# Patient Record
Sex: Female | Born: 1980 | Race: White | Hispanic: No | Smoking: Former smoker
Health system: Southern US, Community
[De-identification: ages and names within clinical notes are randomized; demographics above are authoritative.]

## PROBLEM LIST (undated history)

## (undated) DIAGNOSIS — F419 Anxiety disorder, unspecified: Secondary | ICD-10-CM

## (undated) DIAGNOSIS — F32A Depression, unspecified: Secondary | ICD-10-CM

## (undated) HISTORY — PX: OVARIAN CYST REMOVAL: SHX89

## (undated) HISTORY — PX: HEMORRHOID SURGERY: SHX153

---

## 2021-02-18 DIAGNOSIS — Z419 Encounter for procedure for purposes other than remedying health state, unspecified: Secondary | ICD-10-CM | POA: Diagnosis not present

## 2021-03-05 ENCOUNTER — Encounter (HOSPITAL_COMMUNITY): Payer: Self-pay | Admitting: Family Medicine

## 2021-03-05 ENCOUNTER — Inpatient Hospital Stay (HOSPITAL_COMMUNITY): Payer: Medicaid Other

## 2021-03-05 ENCOUNTER — Inpatient Hospital Stay (HOSPITAL_COMMUNITY)
Admission: AD | Admit: 2021-03-05 | Discharge: 2021-03-05 | Disposition: A | Discharge status: Home / Self Care | Payer: Medicaid Other | Attending: Family Medicine | Admitting: Family Medicine

## 2021-03-05 DIAGNOSIS — O26891 Other specified pregnancy related conditions, first trimester: Secondary | ICD-10-CM | POA: Insufficient documentation

## 2021-03-05 DIAGNOSIS — N939 Abnormal uterine and vaginal bleeding, unspecified: Secondary | ICD-10-CM | POA: Diagnosis not present

## 2021-03-05 DIAGNOSIS — Z3A01 Less than 8 weeks gestation of pregnancy: Secondary | ICD-10-CM | POA: Insufficient documentation

## 2021-03-05 DIAGNOSIS — O209 Hemorrhage in early pregnancy, unspecified: Secondary | ICD-10-CM | POA: Diagnosis not present

## 2021-03-05 DIAGNOSIS — Z87891 Personal history of nicotine dependence: Secondary | ICD-10-CM | POA: Diagnosis not present

## 2021-03-05 DIAGNOSIS — O208 Other hemorrhage in early pregnancy: Secondary | ICD-10-CM | POA: Diagnosis not present

## 2021-03-05 DIAGNOSIS — R109 Unspecified abdominal pain: Secondary | ICD-10-CM | POA: Diagnosis not present

## 2021-03-05 HISTORY — DX: Depression, unspecified: F32.A

## 2021-03-05 HISTORY — DX: Anxiety disorder, unspecified: F41.9

## 2021-03-05 LAB — CBC
HCT: 37.9 % (ref 36.0–46.0)
Hemoglobin: 12.6 g/dL (ref 12.0–15.0)
MCH: 30.4 pg (ref 26.0–34.0)
MCHC: 33.2 g/dL (ref 30.0–36.0)
MCV: 91.5 fL (ref 80.0–100.0)
Platelets: 240 10*3/uL (ref 150–400)
RBC: 4.14 MIL/uL (ref 3.87–5.11)
RDW: 11.9 % (ref 11.5–15.5)
WBC: 10.3 10*3/uL (ref 4.0–10.5)
nRBC: 0 % (ref 0.0–0.2)

## 2021-03-05 LAB — URINALYSIS, ROUTINE W REFLEX MICROSCOPIC
Bilirubin Urine: NEGATIVE
Glucose, UA: NEGATIVE mg/dL
Ketones, ur: NEGATIVE mg/dL
Leukocytes,Ua: NEGATIVE
Nitrite: NEGATIVE
Protein, ur: NEGATIVE mg/dL
Specific Gravity, Urine: >1.03 — ABNORMAL HIGH (ref 1.005–1.030)
pH: 5.5 (ref 5.0–8.0)

## 2021-03-05 LAB — WET PREP, GENITAL
Clue Cells Wet Prep HPF POC: NONE SEEN
Sperm: NONE SEEN
Trich, Wet Prep: NONE SEEN
WBC, Wet Prep HPF POC: <10 (ref <10)
Yeast Wet Prep HPF POC: NONE SEEN

## 2021-03-05 LAB — ABO/RH: ABO/RH(D): O POS

## 2021-03-05 LAB — URINALYSIS, MICROSCOPIC (REFLEX)

## 2021-03-05 LAB — HCG, QUANTITATIVE, PREGNANCY: hCG, Beta Chain, Quant, S: 90090 m[IU]/mL — ABNORMAL HIGH (ref <5)

## 2021-03-05 LAB — POCT PREGNANCY, URINE: Preg Test, Ur: POSITIVE — AB

## 2021-03-05 LAB — HIV ANTIBODY (ROUTINE TESTING W REFLEX): HIV Screen 4th Generation wRfx: NONREACTIVE

## 2021-03-05 NOTE — MAU Provider Note (Signed)
History     CSN: [id number]  Arrival date and time: 03/05/21 1319   Event Date/Time   First Provider Initiated Contact with Patient 03/05/21 1411      Chief Complaint  Patient presents with   Vaginal Bleeding   HPI Marissa Munoz 41 y.o. [redacted]w[redacted]d Comes to MAU with cramping for a few days and vaginal bleeding that started yesterday.  Was spotting and now is wearing a pad as the bleeding is more persistent.    OB History     Gravida  1   Para      Term      Preterm      AB      Living         SAB      IAB      Ectopic      Multiple      Live Births              Past Medical History:  Diagnosis Date   Anxiety    Depression     Past Surgical History:  Procedure Laterality Date   HEMORRHOID SURGERY     OVARIAN CYST REMOVAL      History reviewed. No pertinent family history.  Social History   Tobacco Use   Smoking status: Former    Packs/day: 1.00    Types: Cigarettes   Smokeless tobacco: Never  Substance Use Topics   Alcohol use: Not Currently   Drug use: Never    Allergies:  Allergies  Allergen Reactions   Codeine Nausea And Vomiting    Medications Prior to Admission  Medication Sig Dispense Refill Last Dose   levocetirizine (XYZAL) 5 MG tablet Take 5 mg by mouth every evening.       Review of Systems  Constitutional:  Negative for fever.  Respiratory:  Negative for cough and shortness of breath.   Gastrointestinal:  Positive for abdominal pain.  Genitourinary:  Positive for vaginal bleeding. Negative for dysuria and vaginal discharge.  Physical Exam   Blood pressure 106/72, pulse 72, temperature 98.7 F (37.1 C), resp. rate 18, height 5\' 4"  (1.626 m), weight 60.8 kg, last menstrual period 01/17/2021.  Physical Exam Vitals and nursing note reviewed.  Constitutional:      Appearance: She is well-developed.  HENT:     Head: Normocephalic.  Cardiovascular:     Rate and Rhythm: Regular rhythm.  Pulmonary:     Effort:  Pulmonary effort is normal.  Abdominal:     Palpations: Abdomen is soft.     Tenderness: There is no abdominal tenderness. There is no guarding or rebound.  Musculoskeletal:        General: Normal range of motion.     Cervical back: Neck supple.  Skin:    General: Skin is warm and dry.  Neurological:     Mental Status: She is alert and oriented to person, place, and time.  Psychiatric:        Mood and Affect: Mood normal.        Behavior: Behavior normal.        Thought Content: Thought content normal.    MAU Course  Procedures  MDM Had blood drawn. Vaginal self swabs done. Will need to rule out pregnancy of unknown anatomic location. O positive blood type Ultrasound done - small subchorionic hemorrhage identified with living IUP. Will have appointment with MD soon -awaiting call from the office.  Assessment and Plan  IUP at [redacted]w[redacted]d with subchorionic hemorrhage causing  vaginal bleeding in first trimester.  Plan Pelvic rest Return with severe bleeding. Keep appointments as scheduled in the office. No smoking, no drugs, no alcohol.   Marissa Munoz 03/05/2021, 2:18 PM

## 2021-03-05 NOTE — MAU Note (Incomplete)
Pt had positive HPT and [employer] test. Has been having some cramping on and of for several week. Started spotting today.

## 2021-03-05 NOTE — Discharge Instructions (Signed)
This bleeding is from a subchorionic hemorrhage and we expect it to resolve over time.

## 2021-03-06 LAB — GC/CHLAMYDIA PROBE AMP (~~LOC~~) NOT AT ARMC
Chlamydia: NEGATIVE
Comment: NEGATIVE
Comment: NORMAL
Neisseria Gonorrhea: NEGATIVE

## 2021-03-21 DIAGNOSIS — Z419 Encounter for procedure for purposes other than remedying health state, unspecified: Secondary | ICD-10-CM | POA: Diagnosis not present

## 2021-03-30 ENCOUNTER — Other Ambulatory Visit: Payer: Self-pay

## 2021-03-30 ENCOUNTER — Ambulatory Visit (INDEPENDENT_AMBULATORY_CARE_PROVIDER_SITE_OTHER): Payer: Medicaid Other | Admitting: Family

## 2021-03-30 ENCOUNTER — Encounter: Payer: Self-pay | Admitting: Family

## 2021-03-30 VITALS — BP 94/66 | HR 60 | Temp 97.6°F | Ht 63.5 in | Wt 140.0 lb

## 2021-03-30 DIAGNOSIS — O2241 Hemorrhoids in pregnancy, first trimester: Secondary | ICD-10-CM

## 2021-03-30 DIAGNOSIS — L501 Idiopathic urticaria: Secondary | ICD-10-CM | POA: Diagnosis not present

## 2021-03-30 DIAGNOSIS — Z3A10 10 weeks gestation of pregnancy: Secondary | ICD-10-CM | POA: Diagnosis not present

## 2021-03-30 DIAGNOSIS — M5432 Sciatica, left side: Secondary | ICD-10-CM | POA: Insufficient documentation

## 2021-03-30 HISTORY — DX: Hemorrhoids in pregnancy, first trimester: O22.41

## 2021-03-30 LAB — COMPREHENSIVE METABOLIC PANEL
ALT: 10 U/L (ref 0–35)
AST: 12 U/L (ref 0–37)
Albumin: 3.8 g/dL (ref 3.5–5.2)
Alkaline Phosphatase: 40 U/L (ref 39–117)
BUN: 12 mg/dL (ref 6–23)
CO2: 28 mEq/L (ref 19–32)
Calcium: 8.8 mg/dL (ref 8.4–10.5)
Chloride: 104 mEq/L (ref 96–112)
Creatinine, Ser: 0.7 mg/dL (ref 0.40–1.20)
GFR: 106.82 mL/min (ref >60.00)
Glucose, Bld: 87 mg/dL (ref 70–99)
Potassium: 4.2 mEq/L (ref 3.5–5.1)
Sodium: 139 mEq/L (ref 135–145)
Total Bilirubin: 0.3 mg/dL (ref 0.2–1.2)
Total Protein: 6 g/dL (ref 6.0–8.3)

## 2021-03-30 LAB — TSH: TSH: 0.96 u[IU]/mL (ref 0.35–5.50)

## 2021-03-30 NOTE — Patient Instructions (Addendum)
Continue prenatal vitamins. Follow up with OBGYN for consult.   A referral was placed today for GI as well as the chiropractor.  Please let us know if you have not heard back within 1 week about your referral.  Stop by the lab prior to leaving today. I will notify you of your results once received.   It was a pleasure seeing you today! Please do not hesitate to reach out with any questions and or concerns.  Regards,   Mort Sawyers FNP-C

## 2021-03-30 NOTE — Assessment & Plan Note (Signed)
Pt to f/u with OBGYN., pt has appt already on books. Will order TSH and CMP as well for complete profile prior to appt. Continue prenatal vitamins.

## 2021-03-30 NOTE — Assessment & Plan Note (Signed)
Pt deferred physical exam. Ok to continue with sitz bath prn and to start tux pads.

## 2021-03-30 NOTE — Assessment & Plan Note (Signed)
Continue xyzal ?

## 2021-03-30 NOTE — Assessment & Plan Note (Signed)
Referral placed for chiropractor per pt request. Handout sent to [facility] for pt.

## 2021-03-30 NOTE — Progress Notes (Signed)
New patient Office Visit  Subjective:  Patient ID: Marissa Munoz, female    DOB: 1980-06-14  Age: 41 y.o. MRN: [id number]  CC:  Chief Complaint  Patient presents with   Establish Care    Would like referral to chiropractor for insurance.    HPI Marissa Munoz is here today to establish care.  Pt is without acute concerns.  Pt is pregnant approximately 10 weeks.  Is currently taking prenatal vitamins.   Tested positive for pregnancy back in beginning of January 2023. This is her first pregnancy.  Last period 01/17/21.  Did go to ER 03/05/21 for vaginal bleeding, at that time was [redacted]W[redacted]D Ultrasound done - small subchorionic hemorrhage identified with living IUP in Er. Told to f/u with OBGYN and PCP. 1/16 positive HCG retested.   Has appt with OBGYN 04/04/21 at [facility] with Dr. Aletha Halim.   Left side sciatic: wants referral to chiropractor. Chronic history, intermittent. Posterior leg radiculopathy when it is flaring. Finds it flares about 1-2 times a month, but can last for weeks. Uses heating pad and foam roller with mild relief. Does currently experience this as well.   Hemorrhoids: does have intense past with internal hemorrhoids. She has two external hemorrhoids, and has noticed starting to get a bit more irritate,d and concerned as she has chronic hemorrhoids looking to see if maybe this can be looked into to treat while pregnant. They are currently very irritated and swollen. A little bit painful. Taking warm salt baths at night which is providing her with some relief. Will use tucks pads if needed.   Past Medical History:  Diagnosis Date   Anxiety    Depression     Past Surgical History:  Procedure Laterality Date   HEMORRHOID SURGERY     internal hemorrhoids/ had had necrosis at the time/ rectal prolapse with fissure   OVARIAN CYST REMOVAL Right     No family history on file.  Social History   Socioeconomic History   Marital status:  Soil scientist    Spouse name: joey   Number of children: 0   Years of education: Not on file   Highest education level: Not on file  Occupational History   Occupation: unemployed  Tobacco Use   Smoking status: Former    Packs/day: 1.00    Years: 20.00    Pack years: 20.00    Types: Cigarettes   Smokeless tobacco: Never  Vaping Use   Vaping Use: Never used  Substance and Sexual Activity   Alcohol use: Not Currently   Drug use: Never   Sexual activity: Yes    Partners: Male  Other Topics Concern   Not on file  Social History Narrative   Currently unemployed, working on creative business   Newly pregnant, first child was unexpected   Not much known about family history. Mom passed away at age 80.    Social Determinants of Health   Financial Resource Strain: Not on file  Food Insecurity: Not on file  Transportation Needs: Not on file  Physical Activity: Not on file  Stress: Not on file  Social Connections: Not on file  Intimate Partner Violence: Not on file    Outpatient Medications Prior to Visit  Medication Sig Dispense Refill   levocetirizine (XYZAL) 5 MG tablet Take 5 mg by mouth every evening.     No facility-administered medications prior to visit.    Allergies  Allergen Reactions   Codeine Nausea And Vomiting  ROS Review of Systems  Constitutional:  Negative for chills, fatigue, fever and unexpected weight change.  Respiratory:  Negative for shortness of breath.   Cardiovascular:  Negative for chest pain, palpitations and leg swelling.  Gastrointestinal:  Negative for abdominal pain, anal bleeding, blood in stool, constipation, diarrhea, nausea and rectal pain.       Hemorrhoids itchy and a bit painful at times  Genitourinary:  Negative for difficulty urinating, dysuria, frequency, pelvic pain, vaginal bleeding, vaginal discharge and vaginal pain.  Musculoskeletal:  Positive for back pain (with left sided radiaton of pain down leg at times).       Objective:    Physical Exam Constitutional:      General: She is not in acute distress.    Appearance: Normal appearance. She is normal weight. She is not ill-appearing, toxic-appearing or diaphoretic.  HENT:     Head: Normocephalic.     Right Ear: Tympanic membrane normal.     Left Ear: Tympanic membrane normal.     Nose: Nose normal.     Mouth/Throat:     Mouth: Mucous membranes are moist.  Eyes:     Pupils: Pupils are equal, round, and reactive to light.  Cardiovascular:     Rate and Rhythm: Normal rate and regular rhythm.  Pulmonary:     Effort: Pulmonary effort is normal.     Breath sounds: Normal breath sounds.  Genitourinary:    Comments: Pt refuses rectal exam to evaluate hemorrhoid further, did advise pt that I would like to r/o strangulation/thrombosis and she refuses  Musculoskeletal:        General: No tenderness or signs of injury.     Right lower leg: No edema.     Left lower leg: No edema.  Skin:    General: Skin is warm.  Neurological:     General: No focal deficit present.     Mental Status: She is alert and oriented to person, place, and time.  Psychiatric:        Mood and Affect: Mood normal.        Behavior: Behavior normal.        Thought Content: Thought content normal.        Judgment: Judgment normal.     BP 94/66 (BP Location: Left Arm, Patient Position: Sitting, Cuff Size: Normal)    Pulse 60    Temp 97.6 F (36.4 C)    Ht 5' 3.5" (1.613 m)    Wt 140 lb (63.5 kg)    LMP 01/17/2021 (Exact Date) Comment: Meeting with OB next week   SpO2 98%    BMI 24.41 kg/m  Wt Readings from Last 3 Encounters:  03/30/21 140 lb (63.5 kg)  03/05/21 134 lb (60.8 kg)     There are no preventive care reminders to display for this patient.   There are no preventive care reminders to display for this patient.  No results found for: TSH Lab Results  Component Value Date   WBC 10.3 03/05/2021   HGB 12.6 03/05/2021   HCT 37.9 03/05/2021   MCV 91.5 03/05/2021    PLT 240 03/05/2021   No results found for: NA, K, CHLORIDE, CO2, GLUCOSE, BUN, CREATININE, BILITOT, ALKPHOS, AST, ALT, PROT, ALBUMIN, CALCIUM, ANIONGAP, EGFR, GFR No results found for: CHOL No results found for: HDL No results found for: LDLCALC No results found for: TRIG No results found for: CHOLHDL No results found for: HGBA1C    Assessment & Plan:   Problem List  Items Addressed This Visit       Cardiovascular and Mediastinum   Hemorrhoids during pregnancy in first trimester    Pt deferred physical exam. Ok to continue with sitz bath prn and to start tux pads.       Relevant Orders   Ambulatory referral to Gastroenterology     Nervous and Auditory   Chronic sciatica, left    Referral placed for chiropractor per pt request. Handout sent to [facility] for pt.       Relevant Orders   Ambulatory referral to Chiropractic     Musculoskeletal and Integument   Chronic idiopathic urticaria - Primary    Continue xyzal         Other   [redacted] weeks gestation of pregnancy    Pt to f/u with OBGYN., pt has appt already on books. Will order TSH and CMP as well for complete profile prior to appt. Continue prenatal vitamins.       Relevant Orders   TSH   Comprehensive metabolic panel    No orders of the defined types were placed in this encounter.   Follow-up: Return for annually for CPE.    Eugenia Pancoast, FNP

## 2021-04-03 ENCOUNTER — Inpatient Hospital Stay (HOSPITAL_COMMUNITY)
Admission: AD | Admit: 2021-04-03 | Discharge: 2021-04-04 | Disposition: A | Discharge status: Home / Self Care | Payer: Medicaid Other | Attending: Obstetrics & Gynecology | Admitting: Obstetrics & Gynecology

## 2021-04-03 ENCOUNTER — Encounter: Payer: Self-pay | Admitting: Family

## 2021-04-03 ENCOUNTER — Encounter (HOSPITAL_COMMUNITY): Payer: Self-pay | Admitting: Obstetrics & Gynecology

## 2021-04-03 ENCOUNTER — Other Ambulatory Visit: Payer: Self-pay

## 2021-04-03 DIAGNOSIS — Z885 Allergy status to narcotic agent status: Secondary | ICD-10-CM | POA: Insufficient documentation

## 2021-04-03 DIAGNOSIS — O039 Complete or unspecified spontaneous abortion without complication: Secondary | ICD-10-CM

## 2021-04-03 DIAGNOSIS — O09519 Supervision of elderly primigravida, unspecified trimester: Secondary | ICD-10-CM | POA: Diagnosis not present

## 2021-04-03 DIAGNOSIS — Z87891 Personal history of nicotine dependence: Secondary | ICD-10-CM | POA: Insufficient documentation

## 2021-04-03 DIAGNOSIS — Z3A00 Weeks of gestation of pregnancy not specified: Secondary | ICD-10-CM | POA: Diagnosis not present

## 2021-04-03 DIAGNOSIS — O209 Hemorrhage in early pregnancy, unspecified: Secondary | ICD-10-CM | POA: Diagnosis present

## 2021-04-03 DIAGNOSIS — Z3A10 10 weeks gestation of pregnancy: Secondary | ICD-10-CM | POA: Insufficient documentation

## 2021-04-03 DIAGNOSIS — Z3A11 11 weeks gestation of pregnancy: Secondary | ICD-10-CM | POA: Diagnosis not present

## 2021-04-03 LAB — CBC WITH DIFFERENTIAL/PLATELET
Abs Immature Granulocytes: 0.08 10*3/uL — ABNORMAL HIGH (ref 0.00–0.07)
Basophils Absolute: 0.1 10*3/uL (ref 0.0–0.1)
Basophils Relative: 1 %
Eosinophils Absolute: 0.4 10*3/uL (ref 0.0–0.5)
Eosinophils Relative: 4 %
HCT: 39.1 % (ref 36.0–46.0)
Hemoglobin: 13.5 g/dL (ref 12.0–15.0)
Immature Granulocytes: 1 %
Lymphocytes Relative: 22 %
Lymphs Abs: 2.5 10*3/uL (ref 0.7–4.0)
MCH: 31 pg (ref 26.0–34.0)
MCHC: 34.5 g/dL (ref 30.0–36.0)
MCV: 89.7 fL (ref 80.0–100.0)
Monocytes Absolute: 0.7 10*3/uL (ref 0.1–1.0)
Monocytes Relative: 6 %
Neutro Abs: 7.8 10*3/uL — ABNORMAL HIGH (ref 1.7–7.7)
Neutrophils Relative %: 66 %
Platelets: 257 10*3/uL (ref 150–400)
RBC: 4.36 MIL/uL (ref 3.87–5.11)
RDW: 12.3 % (ref 11.5–15.5)
WBC: 11.7 10*3/uL — ABNORMAL HIGH (ref 4.0–10.5)
nRBC: 0 % (ref 0.0–0.2)

## 2021-04-03 LAB — URINALYSIS, ROUTINE W REFLEX MICROSCOPIC
Bilirubin Urine: NEGATIVE
Glucose, UA: NEGATIVE mg/dL
Ketones, ur: NEGATIVE mg/dL
Leukocytes,Ua: NEGATIVE
Nitrite: NEGATIVE
Protein, ur: NEGATIVE mg/dL
Specific Gravity, Urine: 1.02 (ref 1.005–1.030)
pH: 6 (ref 5.0–8.0)

## 2021-04-03 NOTE — MAU Provider Note (Signed)
History     CSN: [id number]  Arrival date and time: 04/03/21 2214  Chief Complaint  Patient presents with   Abdominal Pain   Vaginal Bleeding   Marissa Munoz is a 41 yo female G1P0 at [redacted]w[redacted]d by LMP (c/s [redacted]w[redacted]d [place]) presenting to the MAU for evaluation of vaginal bleeding and lower abdominal pain.   Reports bilaterally lower abdominal cramping started this morning and has become progressively worse. Constant. She also noticed pink-tinged mucus when she wiped around 1400 today. She then noticed vaginal bleeding like a period with a quarter sized clot when she used the restroom later, prompting evaluation. No tissue like products seen, but did feel like the clot "looked different".   Prior to this, she has felt at her baseline. Denies any fever, vomiting, anorexia, CVA back pain, dysuria, vaginal discharge. No concern for STDs. + Nausea since this started. Has not taken any medications at home for relief.   This pregnancy was unplanned, but desired. 6 wk US showed an IUP + cardiac activity with a subchorionic hemorrhage.   She had an initial prenatal appointment scheduled with Dr. Ilda Basset later today (2/15).   Past Medical History:  Diagnosis Date   Anxiety    Depression     Past Surgical History:  Procedure Laterality Date   HEMORRHOID SURGERY     internal hemorrhoids/ had had necrosis at the time/ rectal prolapse with fissure   OVARIAN CYST REMOVAL Right     History reviewed. No pertinent family history.  Social History   Tobacco Use   Smoking status: Former    Packs/day: 1.00    Years: 20.00    Pack years: 20.00    Types: Cigarettes   Smokeless tobacco: Never  Vaping Use   Vaping Use: Never used  Substance Use Topics   Alcohol use: Not Currently   Drug use: Never    Allergies:  Allergies  Allergen Reactions   Codeine Nausea And Vomiting    Medications Prior to Admission  Medication Sig Dispense Refill Last Dose   levocetirizine (XYZAL) 5 MG tablet Take 5 mg by  mouth every evening.       Review of Systems  Constitutional:  Negative for chills, fatigue and fever.  Respiratory:  Negative for chest tightness and shortness of breath.   Cardiovascular:  Negative for leg swelling.  Gastrointestinal:  Positive for abdominal pain and nausea. Negative for blood in stool, constipation, diarrhea and vomiting.  Genitourinary:  Positive for vaginal bleeding. Negative for dysuria, frequency, hematuria and vaginal discharge.  Musculoskeletal:  Negative for back pain.  Skin:  Negative for rash.  Neurological:  Negative for dizziness, weakness, light-headedness, numbness and headaches.  Physical Exam   Blood pressure 121/76, pulse 84, temperature 98.5 F (36.9 C), resp. rate 17, height 5' 3.5" (1.613 m), weight 64.4 kg, last menstrual period 01/17/2021, SpO2 99 %.  Physical Exam Constitutional:      General: She is not in acute distress.    Appearance: She is well-developed. She is not ill-appearing or diaphoretic.  HENT:     Head: Normocephalic and atraumatic.     Mouth/Throat:     Mouth: Mucous membranes are moist.  Eyes:     Extraocular Movements: Extraocular movements intact.  Cardiovascular:     Rate and Rhythm: Normal rate.  Pulmonary:     Effort: Pulmonary effort is normal.  Abdominal:     Tenderness: There is no right CVA tenderness or left CVA tenderness.     Comments: Soft, non-distended.  Non-tender in all 4 quadrants but points to lower suprapubic/pelvis as location of pain. No skin changes. No Mcburney point tenderness. Small horizontal scar present in the LLQ.  Genitourinary:    Comments: Pelvic exam: VULVA: normal appearing vulva with no masses, tenderness or lesions, VAGINA: normal appearing vagina with normal color, no lesions, no pooling of blood in canal, blood-tinged mucus present. CERVIX: normal appearing cervix. Os appears closed with blood-tinged mucus present at the os. No active bleeding. Neurological:     General: No focal  deficit present.     Mental Status: She is alert.    MAU Course   MDM New onset vaginal bleeding/cramping in previously known IUP at [redacted]w[redacted]d by LMP  CBC, Bhcg quant  Bedside US with gestational sac and small appearing fetus. No cardiac activity able to be detected.  Ordered for formal viability scan. Offered zofran and tylenol, declined.   1218: CBC with hemoglobin WNL (13.5). B-Hcg quant [id number] (from 90,090 in 02/2021).   US showing 7.14mm CRL with no cardiac activity, measuring at [redacted]w[redacted]d. [place] on 03/05/2021 measuring [redacted]w[redacted]d with + cardiac activity. Overall considered diagnostic for failed pregnancy.   Assessment and Plan   1. Spontaneous miscarriage [place] diagnostic for failed pregnancy. Provided condolences. Discussed options: surgical, medical, vs expectant. She would like expectant management for now. Sent message to [facility] to cancel tomorrow's appt but reschedule for f/u in 1-2 weeks.   MAU return precautions strictly discussed. Provided expectations for likely increased cramping/bleeding. May use tylenol/ibuprofen. Declined zofran/opioid prescription. Discharged home in stable condition.     Marissa Munoz 04/03/2021, 10:53 PM

## 2021-04-03 NOTE — MAU Note (Signed)
Having lower abd pain all day which has gotten progressively worse. Tonight I have had spotting at first and then more bleeding like a period.

## 2021-04-04 ENCOUNTER — Encounter: Payer: Medicaid Other | Admitting: Obstetrics and Gynecology

## 2021-04-04 ENCOUNTER — Encounter (HOSPITAL_COMMUNITY): Payer: Self-pay | Admitting: Obstetrics & Gynecology

## 2021-04-04 ENCOUNTER — Inpatient Hospital Stay (EMERGENCY_DEPARTMENT_HOSPITAL)
Admission: AD | Admit: 2021-04-04 | Discharge: 2021-04-05 | Disposition: A | Discharge status: Home / Self Care | Payer: Medicaid Other | Source: Home / Self Care | Attending: Obstetrics & Gynecology | Admitting: Obstetrics & Gynecology

## 2021-04-04 ENCOUNTER — Inpatient Hospital Stay (HOSPITAL_COMMUNITY): Payer: Medicaid Other

## 2021-04-04 DIAGNOSIS — O039 Complete or unspecified spontaneous abortion without complication: Secondary | ICD-10-CM | POA: Insufficient documentation

## 2021-04-04 DIAGNOSIS — F17210 Nicotine dependence, cigarettes, uncomplicated: Secondary | ICD-10-CM | POA: Insufficient documentation

## 2021-04-04 DIAGNOSIS — Z3A11 11 weeks gestation of pregnancy: Secondary | ICD-10-CM | POA: Insufficient documentation

## 2021-04-04 LAB — HCG, QUANTITATIVE, PREGNANCY: hCG, Beta Chain, Quant, S: 3769 m[IU]/mL — ABNORMAL HIGH (ref <5)

## 2021-04-04 MED ORDER — IBUPROFEN 600 MG PO TABS
600.0000 mg | ORAL_TABLET | Freq: Once | ORAL | Status: AC
  Administered 2021-04-05: 600 mg via ORAL
  Filled 2021-04-04: qty 1

## 2021-04-04 MED ORDER — IBUPROFEN 600 MG PO TABS: 600.0000 mg | ORAL_TABLET | Freq: Four times a day (QID) | ORAL | 1 refills | Status: DC | PRN

## 2021-04-04 MED ORDER — HYDROMORPHONE HCL 1 MG/ML IJ SOLN
1.0000 mg | Freq: Once | INTRAMUSCULAR | Status: AC
  Administered 2021-04-04: 1 mg via INTRAMUSCULAR
  Filled 2021-04-04: qty 1

## 2021-04-04 NOTE — Discharge Instructions (Addendum)
You can take tylenol 1000mg  every 6 hours (up to 4,000mg  in 24 hours) and ibuprofen 600mg  every 6 hours.    About 4 out of 100 (0.25%) women will have a pregnancy loss in her lifetime.  One in five pregnancies is found to be an early pregnancy failure.  There are 3 ways to care for an early pregnancy failure:   Surgery, (2) Medicine, (3) Waiting for you to pass the pregnancy on your own. Waiting You may choose to wait, in which case your own body may complete the passing of the abnormal early pregnancy on its own in about 2-4 weeks Your bleeding may be heavy at times There is a small possibility that you may need surgery if the bleeding is too much or not all of the pregnancy has passed.  If you have any of the following problems you may call Maternity Admissions Unit at [phone]. If you have pain that does not get better  with pain medication Bleeding that soaks through 2 thick full-sized sanitary pads in an hour Foul smelling discharge Fever above 100.4 degrees F Even if you do not have any of these symptoms, you should have a follow-up exam to make sure you are healing properly. This appointment will be made for you before you leave the hospital. Your next normal period will start again in 4-6 week after the loss. You can get pregnant soon after the loss.

## 2021-04-04 NOTE — MAU Provider Note (Addendum)
Chief Complaint: Abdominal Pain   Event Date/Time   First Provider Initiated Contact with Patient 04/04/21 2210      SUBJECTIVE HPI: Marissa Munoz is a 41 y.o. G1P0 at [redacted]w[redacted]d by LMP who presents to maternity admissions reporting known missed abortion which began cramping and bleeding tonight.  Was diagnosed yesterday with missed abortion measuring 6wks.  Was brought from waiting room screaming and writhing in wheelchair, out of control.  .  Abdominal Pain This is a new problem. The current episode started today. The problem occurs constantly. The problem has been gradually worsening. The quality of the pain is cramping. The abdominal pain does not radiate. Pertinent negatives include no fever, headaches, myalgias, nausea or vomiting. Nothing aggravates the pain. The pain is relieved by Nothing. She has tried nothing for the symptoms.  RN Note: Pt reports she was seen yesterday and told that she was having a miscarriage. Pt reports with abdominal pain and vaginal bleeding  Past Medical History:  Diagnosis Date   Anxiety    Depression    Past Surgical History:  Procedure Laterality Date   HEMORRHOID SURGERY     internal hemorrhoids/ had had necrosis at the time/ rectal prolapse with fissure   OVARIAN CYST REMOVAL Right    Social History   Socioeconomic History   Marital status: Media planner    Spouse name: joey   Number of children: 0   Years of education: Not on file   Highest education level: Not on file  Occupational History   Occupation: unemployed  Tobacco Use   Smoking status: Former    Packs/day: 1.00    Years: 20.00    Pack years: 20.00    Types: Cigarettes   Smokeless tobacco: Never  Vaping Use   Vaping Use: Never used  Substance and Sexual Activity   Alcohol use: Not Currently   Drug use: Never   Sexual activity: Yes    Partners: Male  Other Topics Concern   Not on file  Social History Narrative   Currently unemployed, working on creative business    Newly pregnant, first child was unexpected   Not much known about family history. Mom passed away at age 69.    Social Determinants of Health   Financial Resource Strain: Not on file  Food Insecurity: Not on file  Transportation Needs: Not on file  Physical Activity: Not on file  Stress: Not on file  Social Connections: Not on file  Intimate Partner Violence: Not on file   No current facility-administered medications on file prior to encounter.   Current Outpatient Medications on File Prior to Encounter  Medication Sig Dispense Refill   Calcium-Magnesium 200-50 MG TABS Take by mouth.     levocetirizine (XYZAL) 5 MG tablet Take 5 mg by mouth every evening.     Allergies  Allergen Reactions   Codeine Nausea And Vomiting    I have reviewed patient's Past Medical Hx, Surgical Hx, Family Hx, Social Hx, medications and allergies.   ROS:  Review of Systems  Constitutional:  Negative for fever.  Gastrointestinal:  Positive for abdominal pain. Negative for nausea and vomiting.  Musculoskeletal:  Negative for myalgias.  Neurological:  Negative for headaches.  Review of Systems  Other systems negative   Physical Exam  Physical Exam Patient Vitals for the past 24 hrs:  BP Temp Temp src Pulse Resp SpO2  04/04/21 2243 (!) 133/91 97.7 F (36.5 C) Oral 77 (!) 24 100 %   Constitutional: Well-developed, well-nourished female  in considerable pain, unable to lie still.   Cardiovascular: normal rate Respiratory: normal effort GI: Abd soft, non-tender.  MS: Extremities nontender, no edema, normal ROM Neurologic: Alert and oriented x 4.  GU: Neg CVAT.  Vaginal bleeding, moderate.  LAB RESULTS  --/--/O POS (01/16 1354)  IMAGING US OB Comp Less 14 Wks  Result Date: 04/05/2021 CLINICAL DATA:  Missed abortion with passage of gestational sac just prior to ultrasound, severe pain EXAM: OBSTETRIC <14 WK ULTRASOUND TECHNIQUE: Transabdominal ultrasound was performed for evaluation of the  gestation as well as the maternal uterus and adnexal regions. COMPARISON:  None. FINDINGS: Intrauterine gestational sac: None Subchorionic hemorrhage:  None visualized. Maternal uterus/adnexae: Thickened endometrium, measuring 12 mm, with fluid/debris in the endometrial cavity and mild vascularity. No free fluid. IMPRESSION: No IUP is visualized. Thickened endometrium, measuring 12 mm, with fluid/debris in the endometrial cavity and mild vascularity. While this suggests residual products of conception, this is not unexpected given the known abortion in progress. If symptoms persist, consider follow-up pelvic ultrasound to assess for RPOC in 3-5 days. Electronically Signed   By: Charline Bills M.D.   On: 04/05/2021 02:37   US OB Comp Less 14 Wks  Result Date: 04/04/2021 CLINICAL DATA:  Pain and heavy bleeding. EXAM: OBSTETRIC <14 WK ULTRASOUND TECHNIQUE: Transabdominal ultrasound was performed for evaluation of the gestation as well as the maternal uterus and adnexal regions. COMPARISON:  03/05/2021. FINDINGS: Intrauterine gestational sac: Single Yolk sac:  No Embryo:  Yes Cardiac Activity: No Heart Rate: No fetal heart rate. CRL:   7.8 mm   6 w 4 d                  [place] EDC: 11/24/2021. Subchorionic hemorrhage:  None visualized. Maternal uterus/adnexae: The ovaries are not well seen on exam. No free fluid in the pelvis. IMPRESSION: Single intrauterine pregnancy with estimated gestational age of [redacted] weeks 4 days. No fetal cardiac activity or fetal heart rate is identified, concerning for failed early pregnancy. Follow-up beta hCG and follow-up ultrasound are recommended. Electronically Signed   By: Thornell Sartorius M.D.   On: 04/04/2021 00:37     MAU Management/MDM: DIlaudid IM given which did help pain.  Speculum exam done Clots and entire intact gestational sac were removed with Ring forceps Pain abated but then returned Patient became out of control again with cramping.  Percocet x 2 given  Phenergan IM  given.   Ibuprofen given Patient continued to be out of control with pain, insisting something was wrong.  Per Dr Macon Large, this is just uterine cramping, pt can be discharged [place] ordered for reassurance >within normal limits Percocet given PO which finally did relieve pain, so that patient could be discharged.  ASSESSMENT S[place]gle IUP at [redacted]w[redacted]d, missed abortion Completed spontaneous abortion  PLAN Discharge home Rx ibuprofen for pain  Has followup appt in office in 4 weeks  Pt stable at time of discharge. Encouraged to return here if she develops worsening of symptoms, increase in pain, fever, or other concerning symptoms.    Wynelle Bourgeois CNM, MSN Certified Nurse-Midwife 04/04/2021  10:58 PM

## 2021-04-04 NOTE — Progress Notes (Signed)
Dr Annia Friendly in earlier to discuss u/s results and d/c plan. Written and verbal d/c instructions given and understanding voiced. PT teary eyed due to dx of SAB. Comfort info and small knitted blanket given with emotional support. Significant other supportive

## 2021-04-04 NOTE — MAU Note (Signed)
Pt reports she was seen yesterday and told that she was having a miscarriage. Pt reports with abdominal pain and vaginal bleeding.

## 2021-04-05 ENCOUNTER — Inpatient Hospital Stay (HOSPITAL_COMMUNITY): Payer: Medicaid Other

## 2021-04-05 DIAGNOSIS — O039 Complete or unspecified spontaneous abortion without complication: Secondary | ICD-10-CM | POA: Diagnosis not present

## 2021-04-05 DIAGNOSIS — O09519 Supervision of elderly primigravida, unspecified trimester: Secondary | ICD-10-CM | POA: Diagnosis not present

## 2021-04-05 DIAGNOSIS — Z3A00 Weeks of gestation of pregnancy not specified: Secondary | ICD-10-CM | POA: Diagnosis not present

## 2021-04-05 DIAGNOSIS — Z3A11 11 weeks gestation of pregnancy: Secondary | ICD-10-CM

## 2021-04-05 MED ORDER — OXYCODONE-ACETAMINOPHEN 5-325 MG PO TABS
1.0000 | ORAL_TABLET | Freq: Once | ORAL | Status: AC
  Administered 2021-04-05: 2 via ORAL
  Filled 2021-04-05: qty 1

## 2021-04-05 MED ORDER — PROMETHAZINE HCL 25 MG/ML IJ SOLN
12.5000 mg | Freq: Once | INTRAMUSCULAR | Status: AC
  Administered 2021-04-05: 12.5 mg via INTRAMUSCULAR
  Filled 2021-04-05: qty 1

## 2021-04-05 MED ORDER — OXYCODONE-ACETAMINOPHEN 5-325 MG PO TABS
1.0000 | ORAL_TABLET | Freq: Once | ORAL | Status: DC
  Filled 2021-04-05: qty 1

## 2021-04-06 ENCOUNTER — Encounter: Payer: Self-pay | Admitting: *Deleted

## 2021-04-06 NOTE — Telephone Encounter (Signed)
Referral for Chiropractor sent to [facility] in [place]. Message sent to patient making her aware of where to call to schedule.   Pt declined scheduling GI appt at this time  Referral Notes    Date Time Type Summary User  04/04/2021 2:38 PM EST General Patient just had a miscarriage and don't want to schedule right now [facility], [place] Kung, CMA  Note Text:  Patient just had a miscarriage and don't want to schedule right now

## 2021-04-18 DIAGNOSIS — Z419 Encounter for procedure for purposes other than remedying health state, unspecified: Secondary | ICD-10-CM | POA: Diagnosis not present

## 2021-05-03 ENCOUNTER — Ambulatory Visit (INDEPENDENT_AMBULATORY_CARE_PROVIDER_SITE_OTHER): Payer: Medicaid Other | Admitting: Obstetrics and Gynecology

## 2021-05-03 ENCOUNTER — Encounter: Payer: Self-pay | Admitting: Obstetrics and Gynecology

## 2021-05-03 VITALS — BP 106/71 | HR 81 | Ht 64.0 in | Wt 140.6 lb

## 2021-05-03 DIAGNOSIS — Z5189 Encounter for other specified aftercare: Secondary | ICD-10-CM | POA: Diagnosis not present

## 2021-05-03 DIAGNOSIS — O039 Complete or unspecified spontaneous abortion without complication: Secondary | ICD-10-CM | POA: Diagnosis not present

## 2021-05-03 DIAGNOSIS — R319 Hematuria, unspecified: Secondary | ICD-10-CM

## 2021-05-03 NOTE — Progress Notes (Signed)
? ?Obstetrics and Gynecology ?New Patient Evaluation ? ?Appointment Date: 05/03/2021 ? ?OBGYN Clinic: [facility]  ? ?Primary Care Provider: Mort Sawyers ? ?Chief Complaint: follow up 7-8wk SAB ? ?History of Present Illness:  Marissa Munoz is a 41 y.o. G1P0010 s/p 2/15 SAB in the MAU.   ? ?Patient went to MAU on 1/16 for VB and u/s showed a 6/5 weeks with FHR 116. She had repeat 2/14-15 VB and u/s showed CRL of 6/4 weeks but no FHR. Options d/w her and she elected for exp management. She came back to the MAU later on that same day on with SAB in progress.  ? ?Patient still with some discomfort in her lower belly, no bleeding. ? ?Review of Systems: Pertinent items noted in HPI and remainder of comprehensive ROS otherwise negative.  ? ? ?Past Medical History:  ?Past Medical History:  ?Diagnosis Date  ? Anxiety   ? Depression   ? ? ?Past Surgical History:  ?Past Surgical History:  ?Procedure Laterality Date  ? HEMORRHOID SURGERY    ? internal hemorrhoids/ had had necrosis at the time/ rectal prolapse with fissure  ? OVARIAN CYST REMOVAL Right   ? ? ?Past Obstetrical History:  ?OB History  ?Gravida Para Term Preterm AB Living  ?1       1    ?SAB IAB Ectopic Multiple Live Births  ?1          ?  ?# Outcome Date GA Lbr Len/2nd Weight Sex Delivery Anes PTL Lv  ?1 SAB 03/2021 [redacted]w[redacted]d         ?  ?Obstetric Comments  ?  ? ?Past Gynecological History: As per HPI. ? ?Social History:  ?Social History  ? ?Socioeconomic History  ? Marital status: Media planner  ?  Spouse name: joey  ? Number of children: 0  ? Years of education: Not on file  ? Highest education level: Not on file  ?Occupational History  ? Occupation: unemployed  ?Tobacco Use  ? Smoking status: Former  ?  Packs/day: 1.00  ?  Years: 20.00  ?  Pack years: 20.00  ?  Types: Cigarettes  ? Smokeless tobacco: Never  ?Vaping Use  ? Vaping Use: Never used  ?Substance and Sexual Activity  ? Alcohol use: Not Currently  ? Drug use: Never  ?  Sexual activity: Yes  ?  Partners: Male  ?Other Topics Concern  ? Not on file  ?Social History Narrative  ? Currently unemployed, working on creative business  ? Newly pregnant, first child was unexpected  ? Not much known about family history. Mom passed away at age 54.   ? ?Social Determinants of Health  ? ?Financial Resource Strain: Not on file  ?Food Insecurity: Not on file  ?Transportation Needs: Not on file  ?Physical Activity: Not on file  ?Stress: Not on file  ?Social Connections: Not on file  ?Intimate Partner Violence: Not on file  ? ? ?Family History: No family history on file. ? ?Medications ?Christinia Gully had no medications administered during this visit. ?Current Outpatient Medications  ?Medication Sig Dispense Refill  ? ibuprofen (ADVIL) 600 MG tablet Take 1 tablet (600 mg total) by mouth every 6 (six) hours as needed. 30 tablet 1  ? levocetirizine (XYZAL) 5 MG tablet Take 5 mg by mouth every evening.    ? Calcium-Magnesium 200-50 MG TABS Take by mouth.    ? ?No current facility-administered medications for this visit.  ? ? ?Allergies ?Codeine ? ? ?  Physical Exam:  ?BP 106/71   Pulse 81   Ht 5\' 4"  (1.626 m)   Wt 140 lb 9.6 oz (63.8 kg)   LMP 01/17/2021 (Exact Date) Comment: Meeting with OB next week  Breastfeeding No   BMI 24.13 kg/m?  Body mass index is 24.13 kg/m?. ?Weight last year:  ?General appearance: Well nourished, well developed female in no acute distress.  ?Neuro/Psych:  Normal mood and affect.  ? ?Laboratory: +hematuria on u/a ? ?Radiology: as per HPI ? ?Assessment: pt stable ? ?Plan:  ?1. Follow-up visit after miscarriage ?Condolences given and d/w her that likely due to [facility] as cause of SAB ?Will get f/u u/s to ensure no retained POCs ?Pap and contraception declined today. Recommend staying on folic acid if considering trying again. D/w her re: timing and increased SAB risk with AMA ?- [facility] PELVIC COMPLETE WITH TRANSVAGINAL; Future ?- Urine Culture ?- Urinalysis, Routine w reflex  microscopic ? ?2. Hematuria, unspecified type ?No e/o infection. Will send formal urine studies. ?- Urinalysis, Routine w reflex microscopic ? ?RTC 6wks for annual and pap ? ?[place] MD ?Attending ?[facility] [employer]) ? ?

## 2021-05-04 LAB — URINALYSIS, ROUTINE W REFLEX MICROSCOPIC
Bilirubin, UA: NEGATIVE
Glucose, UA: NEGATIVE
Ketones, UA: NEGATIVE
Leukocytes,UA: NEGATIVE
Nitrite, UA: NEGATIVE
Protein,UA: NEGATIVE
Specific Gravity, UA: 1.027 (ref 1.005–1.030)
Urobilinogen, Ur: 0.2 mg/dL (ref 0.2–1.0)
pH, UA: 5 (ref 5.0–7.5)

## 2021-05-04 LAB — MICROSCOPIC EXAMINATION
Bacteria, UA: NONE SEEN
Casts: NONE SEEN /lpf
WBC, UA: NONE SEEN /hpf (ref 0–5)

## 2021-05-04 LAB — URINE CULTURE

## 2021-05-08 ENCOUNTER — Ambulatory Visit
Admission: RE | Admit: 2021-05-08 | Discharge: 2021-05-08 | Disposition: A | Discharge status: Home / Self Care | Payer: Medicaid Other | Source: Ambulatory Visit | Attending: Obstetrics and Gynecology | Admitting: Obstetrics and Gynecology

## 2021-05-08 ENCOUNTER — Other Ambulatory Visit: Payer: Self-pay

## 2021-05-08 DIAGNOSIS — R109 Unspecified abdominal pain: Secondary | ICD-10-CM | POA: Diagnosis not present

## 2021-05-08 DIAGNOSIS — Z5189 Encounter for other specified aftercare: Secondary | ICD-10-CM | POA: Insufficient documentation

## 2021-05-08 DIAGNOSIS — O039 Complete or unspecified spontaneous abortion without complication: Secondary | ICD-10-CM | POA: Diagnosis not present

## 2021-05-08 DIAGNOSIS — R188 Other ascites: Secondary | ICD-10-CM | POA: Diagnosis not present

## 2021-05-14 ENCOUNTER — Encounter: Payer: Self-pay | Admitting: Obstetrics and Gynecology

## 2021-05-19 DIAGNOSIS — Z419 Encounter for procedure for purposes other than remedying health state, unspecified: Secondary | ICD-10-CM | POA: Diagnosis not present

## 2021-06-06 ENCOUNTER — Ambulatory Visit: Payer: Medicaid Other | Admitting: Obstetrics and Gynecology

## 2021-06-18 DIAGNOSIS — Z419 Encounter for procedure for purposes other than remedying health state, unspecified: Secondary | ICD-10-CM | POA: Diagnosis not present

## 2021-07-19 DIAGNOSIS — Z419 Encounter for procedure for purposes other than remedying health state, unspecified: Secondary | ICD-10-CM | POA: Diagnosis not present

## 2021-08-18 DIAGNOSIS — Z419 Encounter for procedure for purposes other than remedying health state, unspecified: Secondary | ICD-10-CM | POA: Diagnosis not present

## 2021-09-18 DIAGNOSIS — Z419 Encounter for procedure for purposes other than remedying health state, unspecified: Secondary | ICD-10-CM | POA: Diagnosis not present

## 2021-10-19 DIAGNOSIS — Z419 Encounter for procedure for purposes other than remedying health state, unspecified: Secondary | ICD-10-CM | POA: Diagnosis not present

## 2021-11-18 DIAGNOSIS — Z419 Encounter for procedure for purposes other than remedying health state, unspecified: Secondary | ICD-10-CM | POA: Diagnosis not present

## 2021-12-19 DIAGNOSIS — Z419 Encounter for procedure for purposes other than remedying health state, unspecified: Secondary | ICD-10-CM | POA: Diagnosis not present

## 2022-01-18 DIAGNOSIS — Z419 Encounter for procedure for purposes other than remedying health state, unspecified: Secondary | ICD-10-CM | POA: Diagnosis not present

## 2022-02-18 DIAGNOSIS — Z419 Encounter for procedure for purposes other than remedying health state, unspecified: Secondary | ICD-10-CM | POA: Diagnosis not present

## 2022-03-21 DIAGNOSIS — Z419 Encounter for procedure for purposes other than remedying health state, unspecified: Secondary | ICD-10-CM | POA: Diagnosis not present

## 2022-04-19 DIAGNOSIS — Z419 Encounter for procedure for purposes other than remedying health state, unspecified: Secondary | ICD-10-CM | POA: Diagnosis not present

## 2022-05-20 DIAGNOSIS — Z419 Encounter for procedure for purposes other than remedying health state, unspecified: Secondary | ICD-10-CM | POA: Diagnosis not present

## 2022-06-19 DIAGNOSIS — Z419 Encounter for procedure for purposes other than remedying health state, unspecified: Secondary | ICD-10-CM | POA: Diagnosis not present

## 2022-07-20 DIAGNOSIS — Z419 Encounter for procedure for purposes other than remedying health state, unspecified: Secondary | ICD-10-CM | POA: Diagnosis not present

## 2022-08-19 DIAGNOSIS — Z419 Encounter for procedure for purposes other than remedying health state, unspecified: Secondary | ICD-10-CM | POA: Diagnosis not present

## 2022-09-19 DIAGNOSIS — Z419 Encounter for procedure for purposes other than remedying health state, unspecified: Secondary | ICD-10-CM | POA: Diagnosis not present

## 2022-10-20 DIAGNOSIS — Z419 Encounter for procedure for purposes other than remedying health state, unspecified: Secondary | ICD-10-CM | POA: Diagnosis not present

## 2022-10-22 IMAGING — US US OB < 14 WEEKS - US OB TV
1 series · 15 of 28 positions shown · non-contrast
Comparison: None.

CLINICAL DATA: vaginal bleeding. Six weeks 5 days pregnant by last
menstrual

EXAM:
OBSTETRIC <14 WK US AND TRANSVAGINAL OB US
TECHNIQUE: Both transabdominal and transvaginal ultrasound examinations were
performed for complete evaluation of the gestation as well as the
maternal uterus, adnexal regions, and pelvic cul-de-sac.
Transvaginal technique was performed to assess early pregnancy.

[Series 1: us ob < 14 weeks - us ob tv · 15 of 51 slices shown]
[im 1/51]
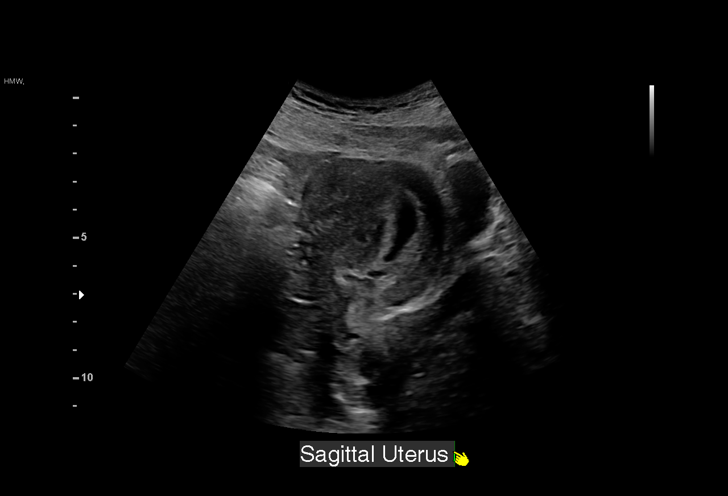
[im 4/51]
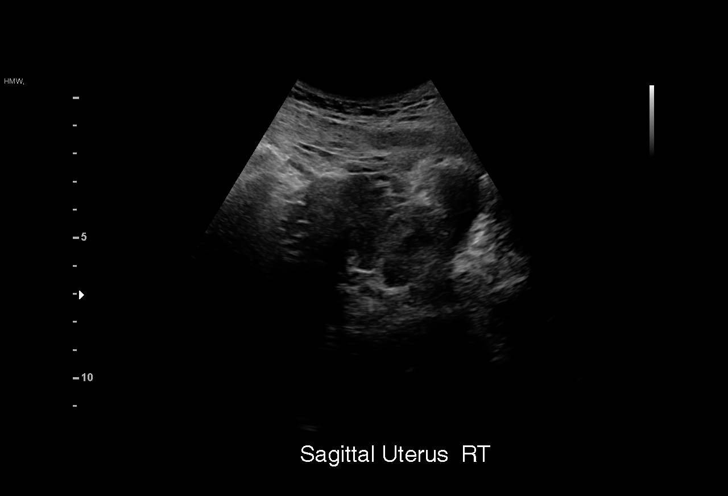
[im 8/51]
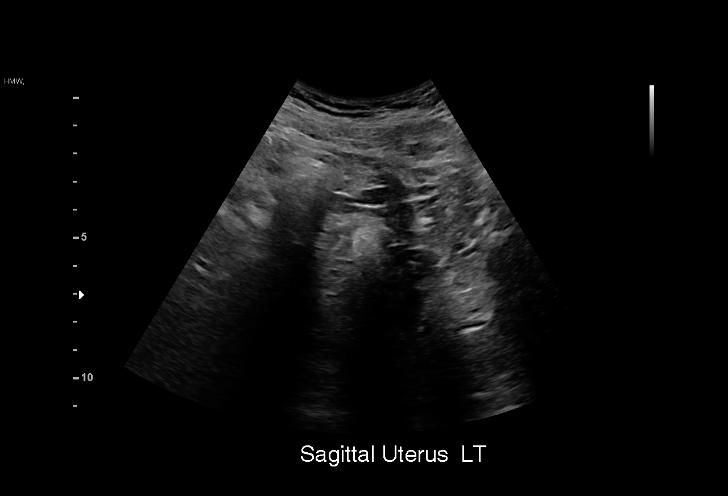
[im 12/51]
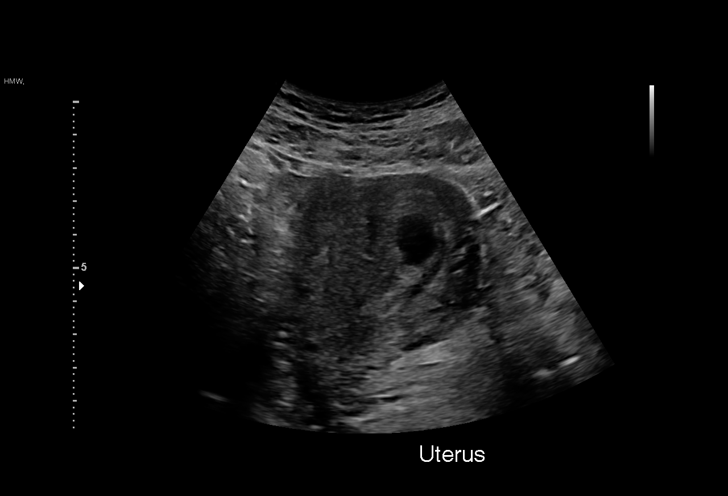
[im 15/51]
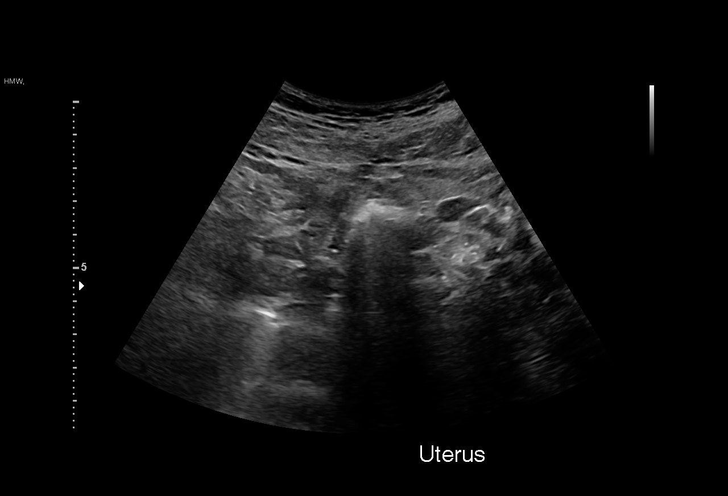
[im 19/51]
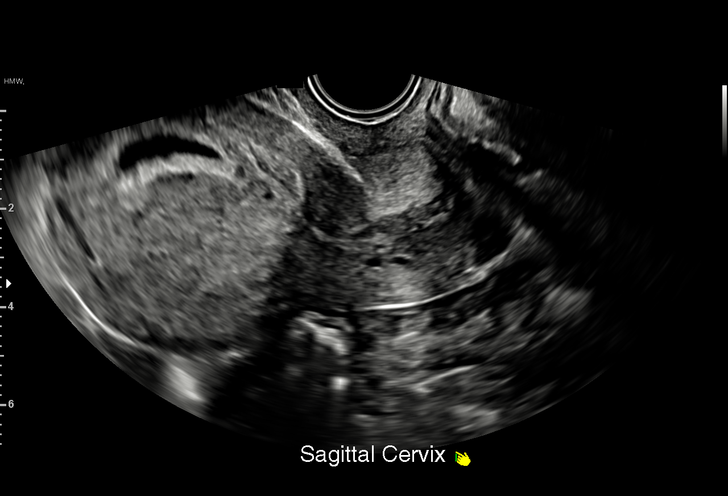
[im 23/51]
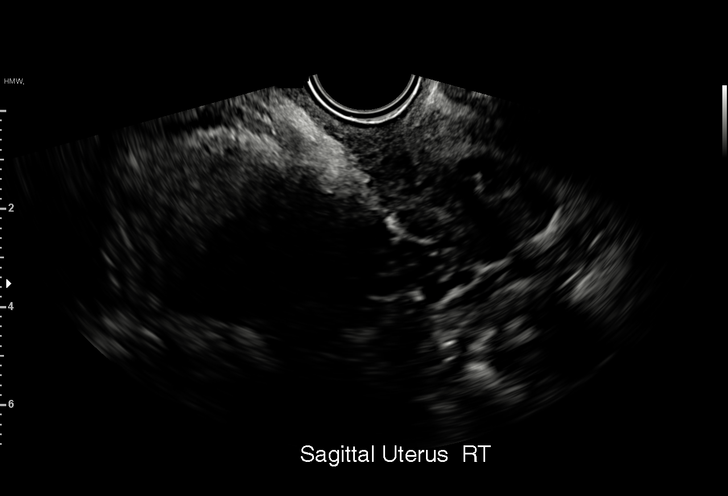
[im 26/51]
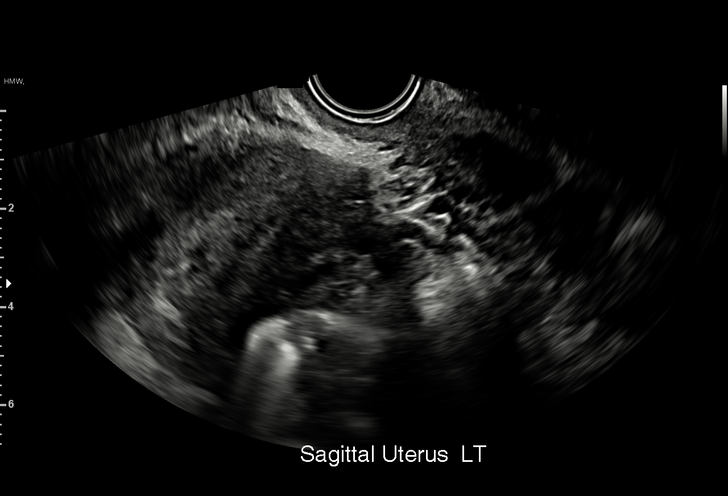
[im 28/51]
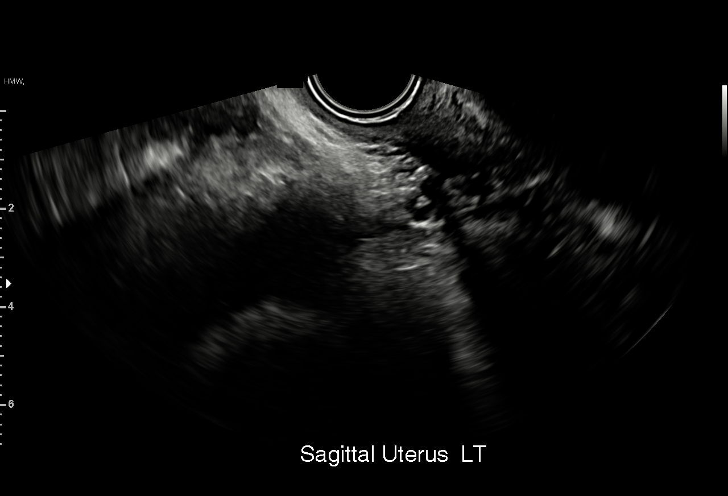
[im 32/51]
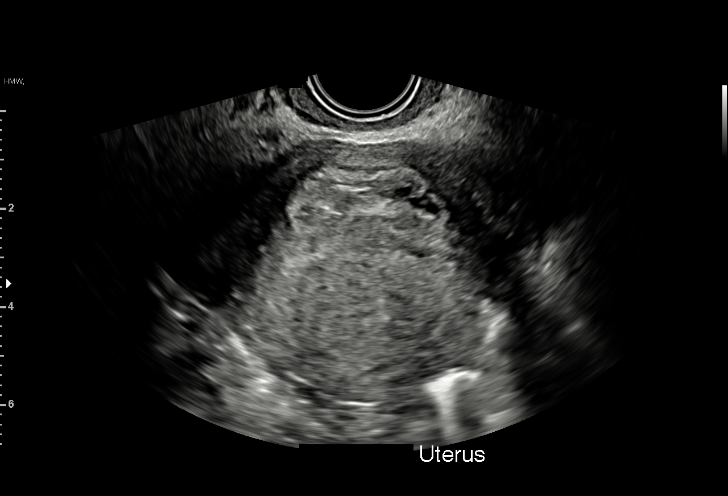
[im 36/51]
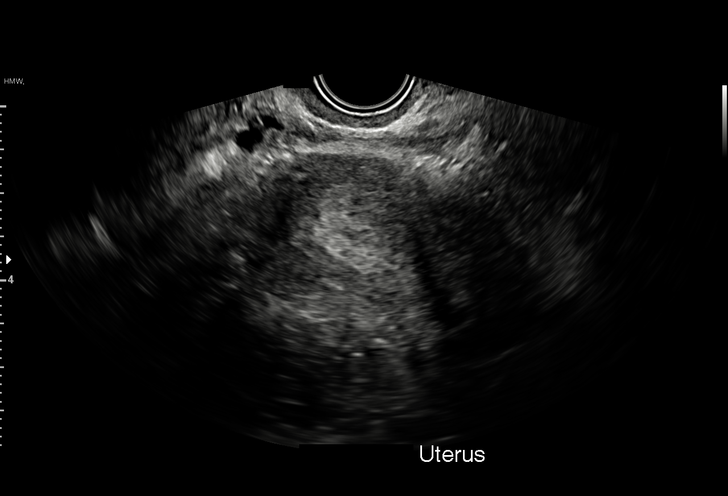
[im 39/51]
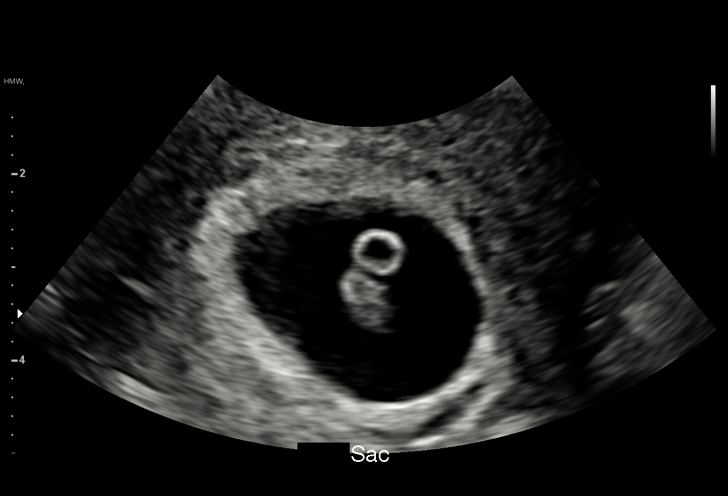
[im 43/51]
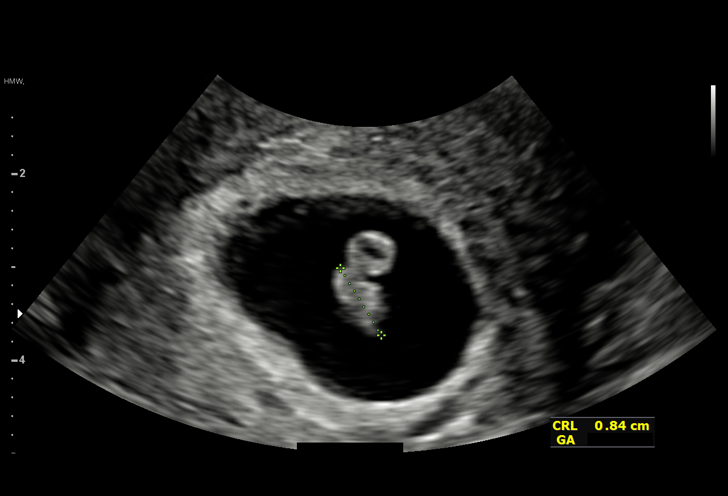
[im 47/51]
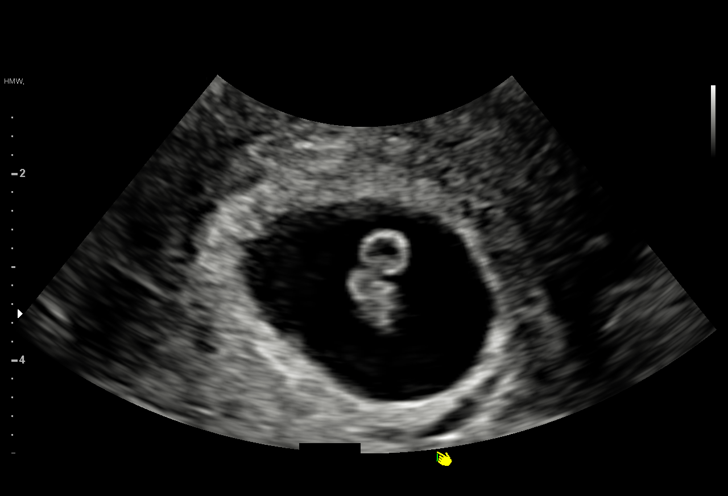
[im 51/51]
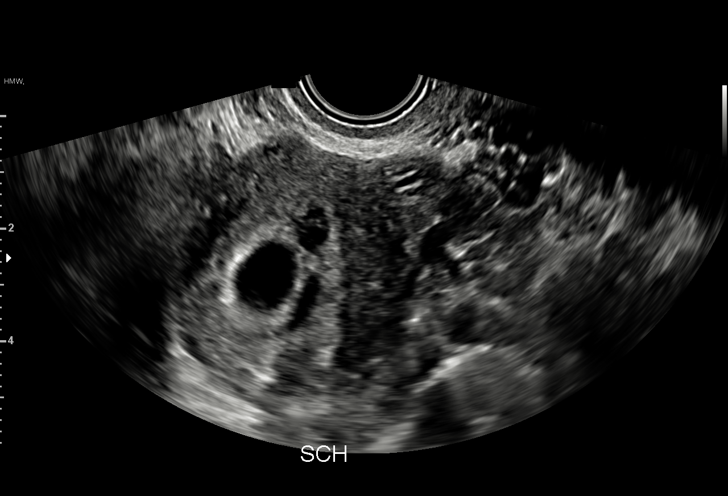

[15 of 28 positions shown; findings below may reference images not displayed]

FINDINGS: Intrauterine gestational sac: Present

Yolk sac:  Present

Embryo:  Present

Cardiac Activity: Present

Heart Rate: 116 bpm

CRL:  9 mm   6 w   5 d                  US EDC: 10/24/2021

Subchorionic hemorrhage: Small subchorionic hemorrhage identified
inferiorly.

Maternal uterus/adnexae: Ovaries not visualized. No adnexal mass. No
significant free fluid.
IMPRESSION: Intrauterine pregnancy of 6 weeks 5 days with fetal heart rate of
116 beats per minute.

Small subchorionic hemorrhage.

## 2022-11-11 ENCOUNTER — Ambulatory Visit: Admitting: Obstetrics and Gynecology

## 2022-11-11 ENCOUNTER — Encounter: Payer: Self-pay | Admitting: Obstetrics and Gynecology

## 2022-11-11 ENCOUNTER — Other Ambulatory Visit (HOSPITAL_COMMUNITY)
Admission: RE | Admit: 2022-11-11 | Discharge: 2022-11-11 | Disposition: A | Discharge status: Home / Self Care | Source: Ambulatory Visit | Attending: Obstetrics and Gynecology | Admitting: Obstetrics and Gynecology

## 2022-11-11 VITALS — BP 119/76 | HR 69 | Ht 63.0 in | Wt 121.0 lb

## 2022-11-11 DIAGNOSIS — Z1339 Encounter for screening examination for other mental health and behavioral disorders: Secondary | ICD-10-CM | POA: Diagnosis not present

## 2022-11-11 DIAGNOSIS — Z3162 Encounter for fertility preservation counseling: Secondary | ICD-10-CM | POA: Diagnosis present

## 2022-11-11 DIAGNOSIS — Z01419 Encounter for gynecological examination (general) (routine) without abnormal findings: Secondary | ICD-10-CM | POA: Insufficient documentation

## 2022-11-11 DIAGNOSIS — N941 Unspecified dyspareunia: Secondary | ICD-10-CM

## 2022-11-11 DIAGNOSIS — R6882 Decreased libido: Secondary | ICD-10-CM | POA: Diagnosis not present

## 2022-11-11 NOTE — Progress Notes (Unsigned)
Patient presents for Annual/Infertility  *Pt requested Provider use small speculum.   LMP: 10/29/2022 Monthly lasting 5-6 days Noticed lately have been irregular..  Last pap:  70yrs ago per pt WNL  Contraception: None Mammogram:  Needs Mammogram.[place] area.  No Family Hx of Breast Cancer. STD Screening: if Ins Covers.    CC:Pt had SAB.  Actively trying to conceive since Jan. 2024

## 2022-11-11 NOTE — Progress Notes (Unsigned)
Obstetrics and Gynecology Annual Patient Evaluation  Appointment Date: 11/11/2022  OBGYN Clinic: [facility]  Primary Care Provider: Mort Sawyers  Referring Provider: Mort Sawyers, FNP  Chief Complaint:  Chief Complaint  Patient presents with   Gynecologic Exam    History of Present Illness: Marissa Munoz is a 42 y.o. G1P0010 (Patient's last menstrual period was 10/29/2022 (exact date).), seen for the above chief complaint. Her past medical history is significant for ***   Pain during sex Approximatley 25 days Trying   No breast s/s, fevers, chills, chest pain, SOB, nausea, vomiting, abdominal pain, dysuria, hematuria, vaginal itching, dyspareunia, SUI or OAB, diarrhea, constipation, blood in BMs  Review of Systems: Pertinent items noted in HPI and remainder of comprehensive ROS otherwise negative.   Past Medical History:  Past Medical History:  Diagnosis Date   Anxiety    Depression    Hemorrhoids during pregnancy in first trimester 03/30/2021    Past Surgical History:  Past Surgical History:  Procedure Laterality Date   HEMORRHOID SURGERY     internal hemorrhoids/ had had necrosis at the time/ rectal prolapse with fissure   OVARIAN CYST REMOVAL Right     Past Obstetrical History:  OB History  Gravida Para Term Preterm AB Living  1       1    SAB IAB Ectopic Multiple Live Births  1            # Outcome Date GA Lbr Len/2nd Weight Sex Type Anes PTL Lv  1 SAB 03/2021 [redacted]w[redacted]d           Obstetric Comments      Past Gynecological History: As per HPI. Periods: qmonth, regular, <1wk, heavy throughout, approximately 25 day cycle length History of STI(s): No. She is currently using no method for contraception.   Social History:  Social History   Socioeconomic History   Marital status: Media planner    Spouse name: joey   Number of children: 0   Years of education: Not on file   Highest education level: Not on file   Occupational History   Occupation: unemployed  Tobacco Use   Smoking status: Former    Current packs/day: 1.00    Average packs/day: 1 pack/day for 20.0 years (20.0 ttl pk-yrs)    Types: Cigarettes   Smokeless tobacco: Never  Vaping Use   Vaping status: Never Used  Substance and Sexual Activity   Alcohol use: Not Currently   Drug use: Never   Sexual activity: Yes    Partners: Male  Other Topics Concern   Not on file  Social History Narrative   Currently unemployed, working on creative business   Not much known about family history. Mom passed away at age 5.    Social Determinants of Health   Financial Resource Strain: Not on file  Food Insecurity: Not on file  Transportation Needs: Not on file  Physical Activity: Not on file  Stress: Not on file  Social Connections: Not on file  Intimate Partner Violence: Not on file    Family History:  She denies any female cancers  Medications [name] had no medications administered during this visit. Current Outpatient Medications  Medication Sig Dispense Refill   ibuprofen (ADVIL) 600 MG tablet Take 1 tablet (600 mg total) by mouth every 6 (six) hours as needed. (Patient not taking: Reported on 11/11/2022) 30 tablet 1   levocetirizine (XYZAL) 5 MG tablet Take 5 mg by mouth every evening. (Patient not taking:  Reported on 11/11/2022)     No current facility-administered medications for this visit.    Allergies Codeine   Physical Exam:  BP 119/76   Pulse 69   Ht 5\' 3"  (1.6 m)   Wt 121 lb (54.9 kg)   LMP 10/29/2022 (Exact Date)   BMI 21.43 kg/m  Body mass index is 21.43 kg/m. Weight last year: *** General appearance: Well nourished, well developed female in no acute distress.  Neck:  Supple, normal appearance, and no thyromegaly  Cardiovascular: normal s1 and s2.  No murmurs, rubs or gallops. Respiratory:  Clear to auscultation bilateral. Normal respiratory effort Abdomen: positive bowel sounds and no masses,  hernias; diffusely non tender to palpation, non distended Breasts: {pe breast exam:315056::"breasts appear normal, no suspicious masses, no skin or nipple changes or axillary nodes"}. Neuro/Psych:  Normal mood and affect.  Skin:  Warm and dry.  Lymphatic:  No inguinal lymphadenopathy.   {Blank single:19197::"Cervical exam performed in the presence of a chaperone","Cervical exam deferred"} Pelvic exam: {ACTION; IS/IS JSE:83151761} limited by body habitus EGBUS: within normal limits Vagina: within normal limits and with {no/min/mod:60509} blood or discharge in the vault Cervix: normal appearing cervix without tenderness, discharge or lesions. IUD strings *** Uterus:  {Desc; uterus-size & shape:16618} and non tender Adnexa:  {exam; adnexa:12223} Rectovaginal: ***  Laboratory: none  Radiology: none  Assessment: patient stable  Plan: *** 1. Well woman exam with routine gynecological exam ***  2. Encounter for fertility preservation counseling ***  No orders of the defined types were placed in this encounter.   RTC ***  Return if symptoms worsen or fail to improve.  No future appointments.  Cornelia Copa MD Attending [facility])

## 2022-11-14 LAB — CYTOLOGY - PAP
Adequacy: ABSENT
Chlamydia: NEGATIVE
Comment: NEGATIVE
Comment: NEGATIVE
Comment: NEGATIVE
Comment: NORMAL
Diagnosis: NEGATIVE
High risk HPV: NEGATIVE
Neisseria Gonorrhea: NEGATIVE
Trichomonas: NEGATIVE

## 2022-11-19 DIAGNOSIS — Z419 Encounter for procedure for purposes other than remedying health state, unspecified: Secondary | ICD-10-CM | POA: Diagnosis not present

## 2022-11-21 IMAGING — US US OB COMP LESS 14 WK
1 series · 15 of 28 positions shown · non-contrast
Comparison: 03/05/2021.

CLINICAL DATA: Pain and heavy bleeding.

EXAM:
OBSTETRIC <14 WK ULTRASOUND
TECHNIQUE: Transabdominal ultrasound was performed for evaluation of the
gestation as well as the maternal uterus and adnexal regions.

[Series 1: us ob comp less 14 wk · 15 of 31 slices shown]
[im 1/31]
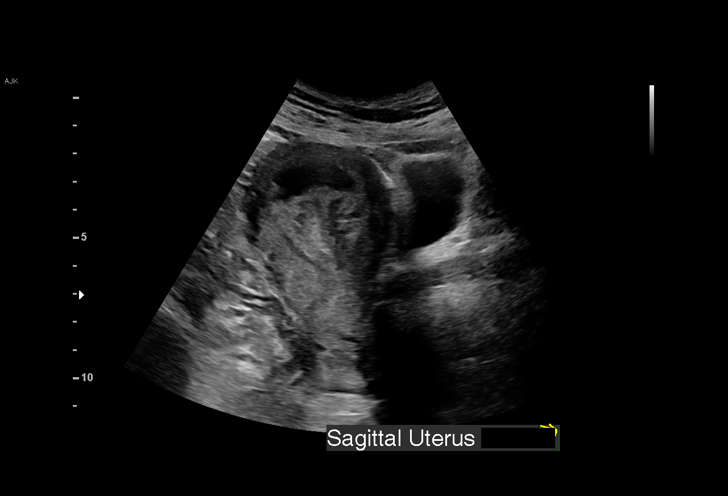
[im 3/31]
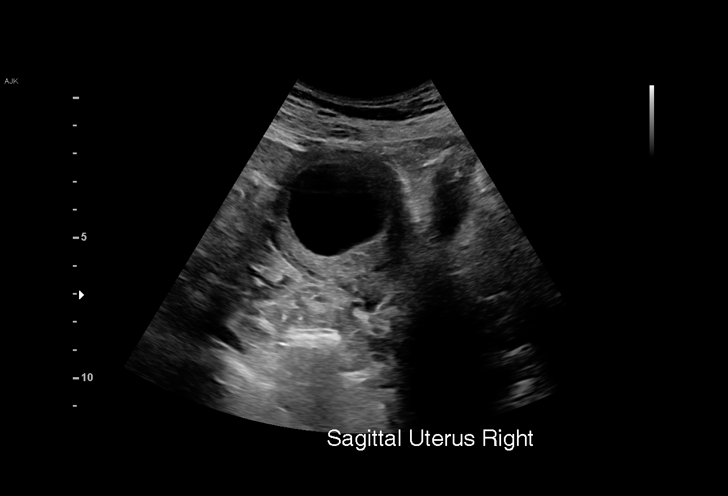
[im 5/31]
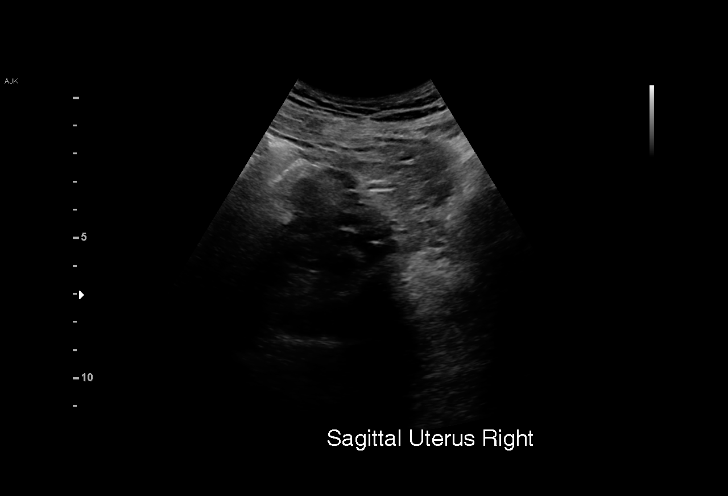
[im 7/31]
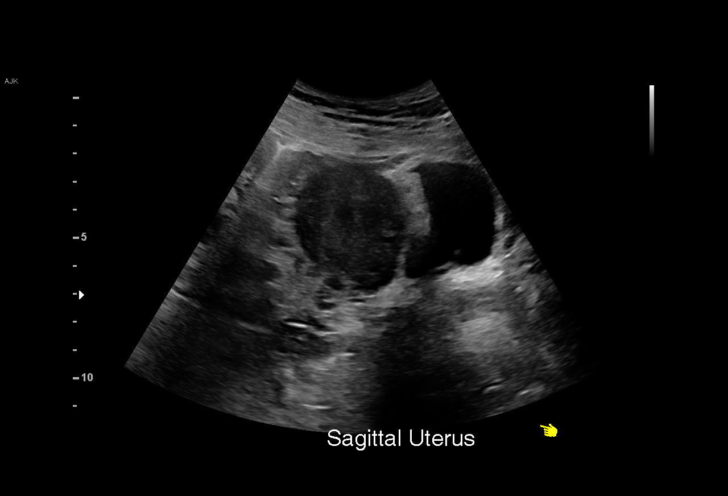
[im 9/31]
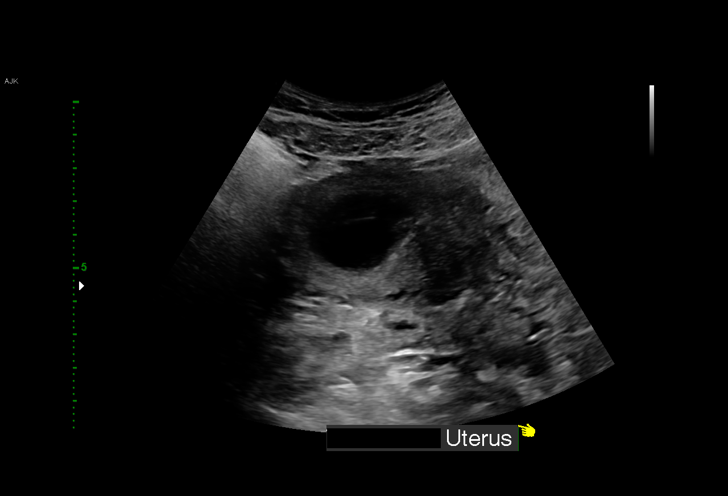
[im 12/31]
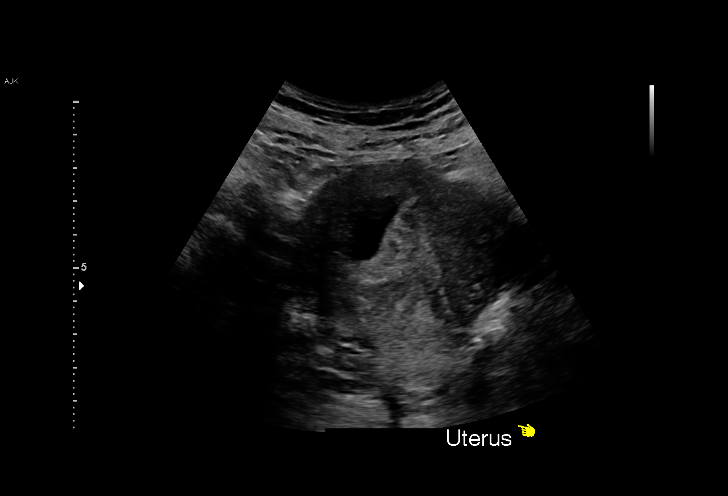
[im 14/31]
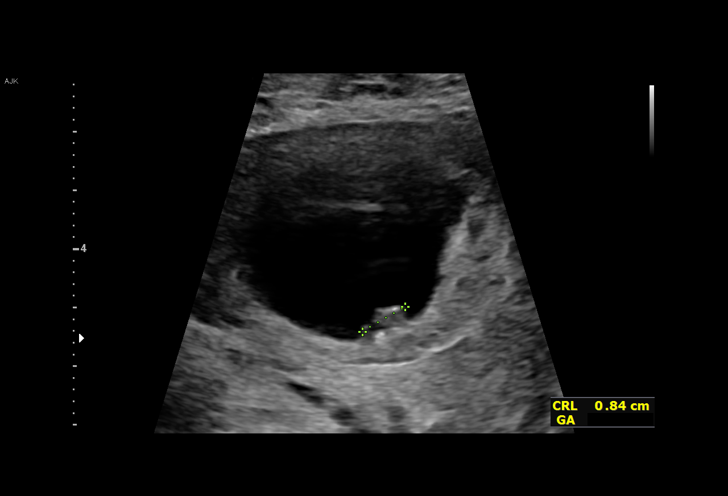
[im 16/31]
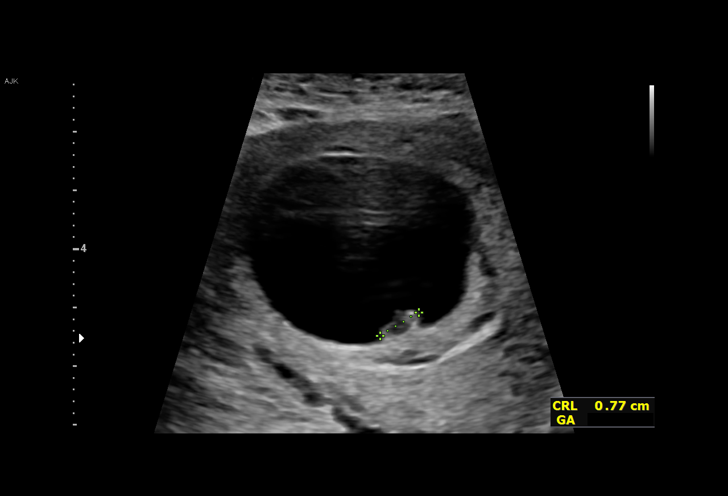
[im 17/31]
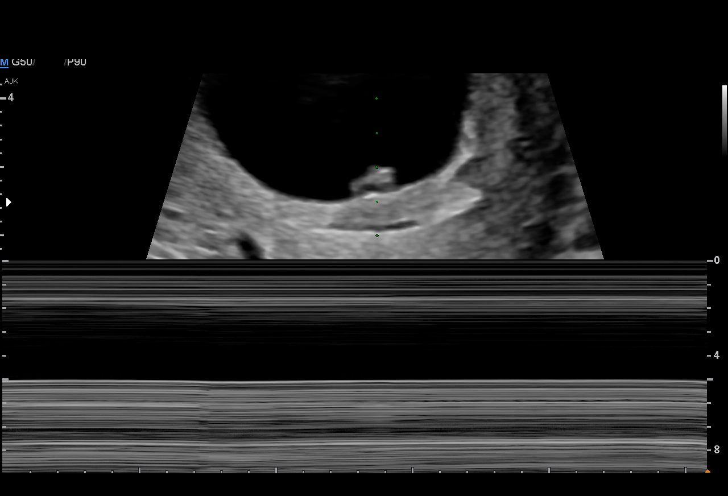
[im 19/31]
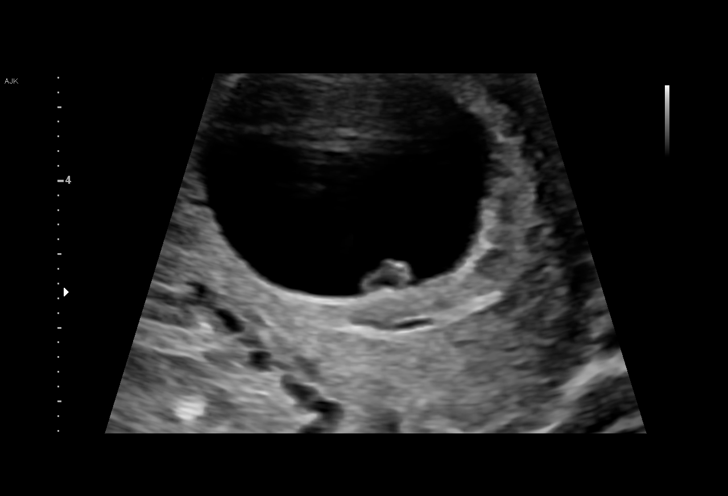
[im 22/31]
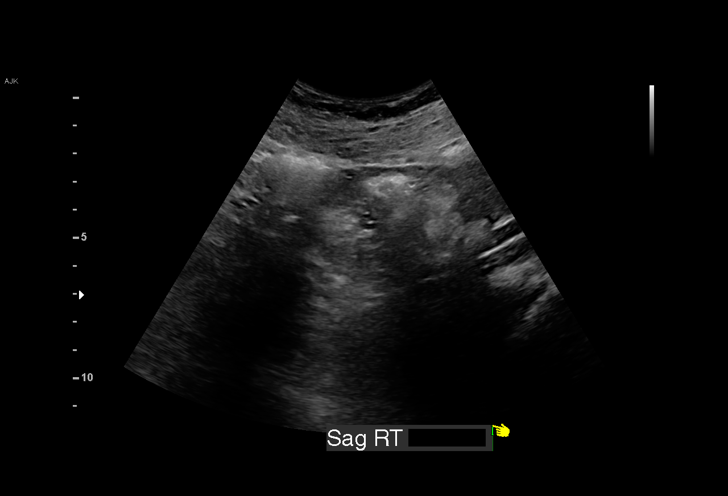
[im 24/31]
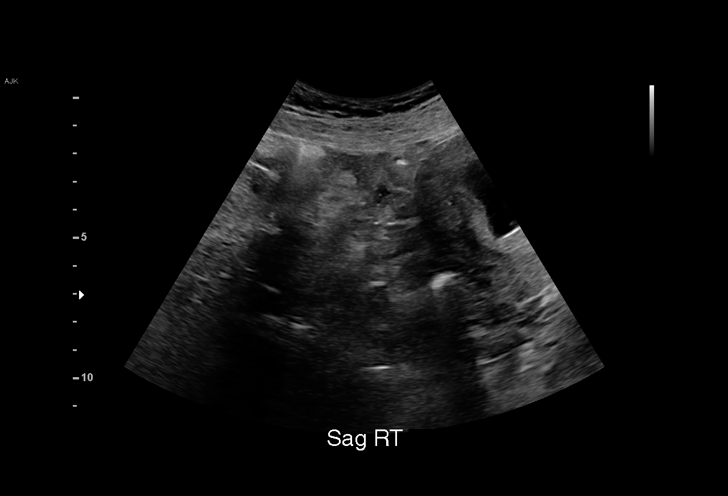
[im 26/31]
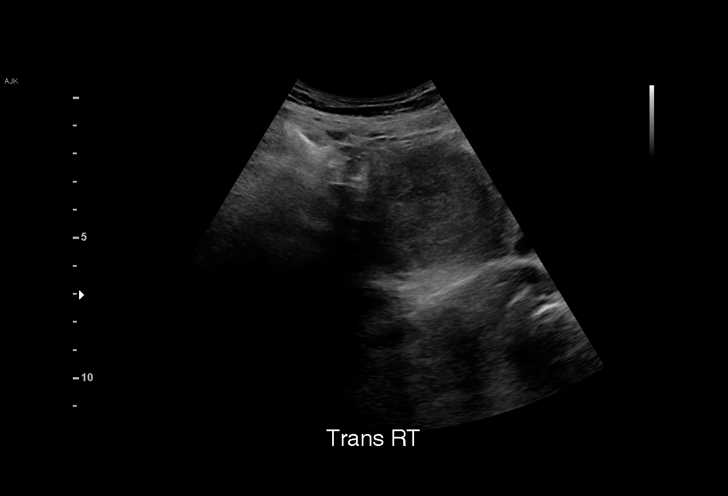
[im 28/31]
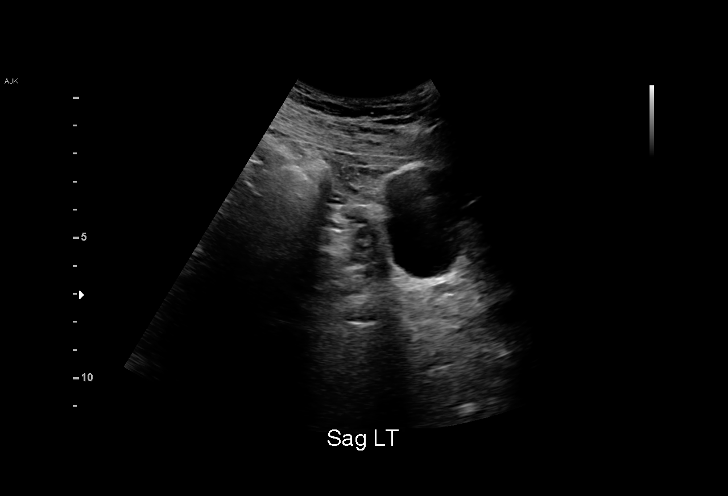
[im 31/31]
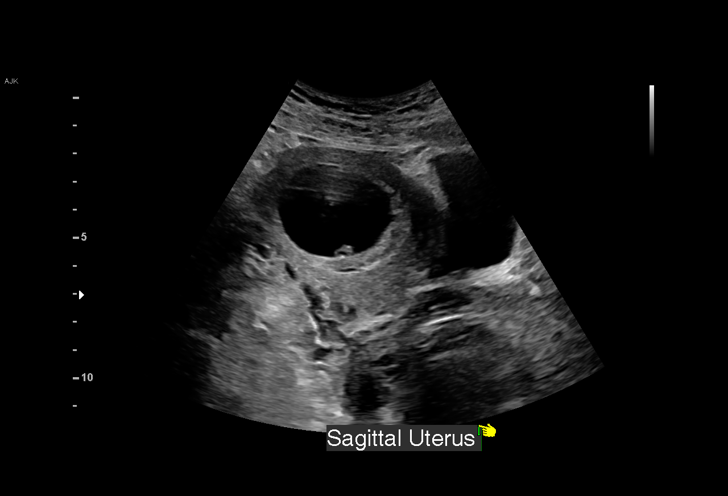

[15 of 28 positions shown; findings below may reference images not displayed]

FINDINGS: Intrauterine gestational sac: Single

Yolk sac:  No

Embryo:  Yes

Cardiac Activity: No

Heart Rate: No fetal heart rate.

CRL:   7.8 mm   6 w 4 d                  US EDC: 11/24/2021.

Subchorionic hemorrhage:  None visualized.

Maternal uterus/adnexae: The ovaries are not well seen on exam. No
free fluid in the pelvis.
IMPRESSION: Single intrauterine pregnancy with estimated gestational age of 6
weeks 4 days. No fetal cardiac activity or fetal heart rate is
identified, concerning for failed early pregnancy. Follow-up beta
hCG and follow-up ultrasound are recommended.

## 2022-11-25 ENCOUNTER — Ambulatory Visit: Admitting: Family

## 2022-11-26 ENCOUNTER — Ambulatory Visit (INDEPENDENT_AMBULATORY_CARE_PROVIDER_SITE_OTHER): Admitting: Family

## 2022-11-26 ENCOUNTER — Encounter: Payer: Self-pay | Admitting: Family

## 2022-11-26 VITALS — BP 126/84 | HR 74 | Temp 97.5°F | Ht 63.0 in | Wt 126.8 lb

## 2022-11-26 DIAGNOSIS — R21 Rash and other nonspecific skin eruption: Secondary | ICD-10-CM

## 2022-11-26 DIAGNOSIS — L301 Dyshidrosis [pompholyx]: Secondary | ICD-10-CM | POA: Diagnosis not present

## 2022-11-26 DIAGNOSIS — M21859 Other specified acquired deformities of unspecified thigh: Secondary | ICD-10-CM

## 2022-11-26 MED ORDER — TRIAMCINOLONE ACETONIDE 0.1 % EX CREA: 1.0000 | TOPICAL_CREAM | Freq: Two times a day (BID) | CUTANEOUS | 0 refills | Status: AC

## 2022-11-26 MED ORDER — CLOBETASOL PROPIONATE 0.05 % EX CREA: TOPICAL_CREAM | CUTANEOUS | 0 refills | Status: DC

## 2022-11-26 NOTE — Patient Instructions (Signed)
A referral was placed today for both physical therapy and well as dermatology. Please let us know if you have not heard back within 2 weeks about the referral.   Regards,   Altheria Shadoan FNP-C

## 2022-11-26 NOTE — Progress Notes (Unsigned)
Established Patient Office Visit  Subjective:   Patient ID: Marissa Munoz, female    DOB: 10/18/1980  Age: 42 y.o. MRN: [id number]  CC:  Chief Complaint  Patient presents with   Medical Management of Chronic Issues    HPI: Marissa Munoz is a 42 y.o. female presenting on 11/26/2022 for Medical Management of Chronic Issues  H/o idiopathic chronic urticaria, xyzal with a flare up.  January of 2024, new finding of eczema with dry peeling skin.  She states since then she had patches of areas on her bil palms where skin 'always feels tight and always feels like bumps under the skin. She does flare up more from time to time, will feel bumps coming up and then itches a lot and wlil be very dry. Also occurs on her bil feet.   H/o hip dysplasia, she was told about twenty years ago she had this and would later lead to some treatment of some sort. She states worse with doing the dishes for at least twenty minutes.      ROS: Negative unless specifically indicated above in HPI.   Relevant past medical history reviewed and updated as indicated.   Allergies and medications reviewed and updated.   Current Outpatient Medications:    clobetasol cream (TEMOVATE) 0.05 %, Apply to feet twice daily for two weeks then twice weekly maintenance to feet, Disp: 30 g, Rfl: 0   triamcinolone cream (KENALOG) 0.1 %, Apply 1 Application topically 2 (two) times daily., Disp: 30 g, Rfl: 0  Allergies  Allergen Reactions   Codeine Nausea And Vomiting    Objective:   BP 126/84 (BP Location: Left Arm, Patient Position: Sitting, Cuff Size: Normal)   Pulse 74   Temp (!) 97.5 F (36.4 C) (Temporal)   Ht 5\' 3"  (1.6 m)   Wt 126 lb 12.8 oz (57.5 kg)   LMP 10/29/2022 (Exact Date)   SpO2 98%   BMI 22.46 kg/m    Physical Exam Constitutional:      General: She is not in acute distress.    Appearance: Normal appearance. She is normal weight. She is not ill-appearing, toxic-appearing or diaphoretic.   HENT:     Head: Normocephalic.  Cardiovascular:     Rate and Rhythm: Normal rate.  Pulmonary:     Effort: Pulmonary effort is normal.  Musculoskeletal:        General: Normal range of motion.     Cervical back: Tenderness (point tenderness medial with slight fixed nodule) present.  Skin:    Comments: Bil palmar aspect of hands with raised clear vesicles  Left posterior heel with scaling and peeling skin with some excoriation. Scaly.  Right posterior heel with similar findings only slightly better than left. Scaly.   Neurological:     General: No focal deficit present.     Mental Status: She is alert and oriented to person, place, and time. Mental status is at baseline.  Psychiatric:        Mood and Affect: Mood normal.        Behavior: Behavior normal.        Thought Content: Thought content normal.        Judgment: Judgment normal.        Assessment & Plan:  Dyshidrotic eczema Assessment & Plan: Rx sent in for triamcinolone.  Patient advised to apply twice daily for 2 and then twice weekly for maintenance.   Rx also sent in.  Dyshidrotic eczema vs psoriasis as ddx Referral placed  for dermatology as well for ongoing idiopathic urticaria and eczema.  Did have discussion with pt on elimination diet, as could be provoked by food and or environmental allergy.  Orders: -     Triamcinolone Acetonide; Apply 1 Application topically 2 (two) times daily.  Dispense: 30 g; Refill: 0 -     Clobetasol Propionate; Apply to feet twice daily for two weeks then twice weekly maintenance to feet  Dispense: 30 g; Refill: 0 -     Ambulatory referral to Dermatology -     Food Allergy Profile w/ Reflexes; Future  Skin rash -     Ambulatory referral to Dermatology -     Food Allergy Profile w/ Reflexes; Future  Acquired dysplasia of hip, unspecified laterality Assessment & Plan: Ongoing, chronic.  Not improving, physical therapy ordered for eval/treat. Goal, strengthen surrounding muscles  and help with stability.   Orders: -     Ambulatory referral to Physical Therapy     Follow up plan: Return if symptoms worsen or fail to improve.  Marissa Sawyers, FNP

## 2022-11-27 ENCOUNTER — Encounter: Payer: Self-pay | Admitting: Family

## 2022-11-27 DIAGNOSIS — L301 Dyshidrosis [pompholyx]: Secondary | ICD-10-CM | POA: Insufficient documentation

## 2022-11-27 DIAGNOSIS — M21859 Other specified acquired deformities of unspecified thigh: Secondary | ICD-10-CM | POA: Insufficient documentation

## 2022-11-27 NOTE — Assessment & Plan Note (Signed)
Rx sent in for triamcinolone.  Patient advised to apply twice daily for 2 and then twice weekly for maintenance.   Rx also sent in.  Dyshidrotic eczema vs psoriasis as ddx Referral placed for dermatology as well for ongoing idiopathic urticaria and eczema.  Did have discussion with pt on elimination diet, as could be provoked by food and or environmental allergy.

## 2022-11-27 NOTE — Assessment & Plan Note (Signed)
Ongoing, chronic.  Not improving, physical therapy ordered for eval/treat. Goal, strengthen surrounding muscles and help with stability.

## 2022-12-10 ENCOUNTER — Ambulatory Visit: Attending: Family | Admitting: Physical Therapy

## 2022-12-10 ENCOUNTER — Encounter: Payer: Self-pay | Admitting: Physical Therapy

## 2022-12-10 DIAGNOSIS — M21859 Other specified acquired deformities of unspecified thigh: Secondary | ICD-10-CM | POA: Diagnosis not present

## 2022-12-10 DIAGNOSIS — M25551 Pain in right hip: Secondary | ICD-10-CM | POA: Diagnosis not present

## 2022-12-10 DIAGNOSIS — M792 Neuralgia and neuritis, unspecified: Secondary | ICD-10-CM | POA: Diagnosis not present

## 2022-12-10 DIAGNOSIS — M25552 Pain in left hip: Secondary | ICD-10-CM | POA: Diagnosis not present

## 2022-12-10 DIAGNOSIS — M5459 Other low back pain: Secondary | ICD-10-CM | POA: Insufficient documentation
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
## 2022-12-16 ENCOUNTER — Ambulatory Visit: Admitting: Physical Therapy

## 2022-12-16 ENCOUNTER — Encounter: Payer: Self-pay | Admitting: Physical Therapy

## 2022-12-16 VITALS — BP 109/70 | HR 99

## 2022-12-16 DIAGNOSIS — M21859 Other specified acquired deformities of unspecified thigh: Secondary | ICD-10-CM | POA: Diagnosis not present

## 2022-12-16 DIAGNOSIS — M792 Neuralgia and neuritis, unspecified: Secondary | ICD-10-CM | POA: Diagnosis not present

## 2022-12-16 DIAGNOSIS — M25551 Pain in right hip: Secondary | ICD-10-CM

## 2022-12-16 DIAGNOSIS — M5459 Other low back pain: Secondary | ICD-10-CM | POA: Diagnosis not present

## 2022-12-16 DIAGNOSIS — M25552 Pain in left hip: Secondary | ICD-10-CM | POA: Diagnosis not present

## 2022-12-16 NOTE — Therapy (Unsigned)
OUTPATIENT PHYSICAL THERAPY TREATMENT   Patient Name: Marissa Munoz MRN: [id number] DOB:Aug 07, 1980, 42 y.o., female Today's Date: 12/17/2022  END OF SESSION:  PT End of Session - 12/16/22 1528     Visit Number 2    Number of Visits 17    Date for PT Re-Evaluation 03/04/23    Authorization Type [employer]  reporting period from 12/10/2022    Progress Note Due on Visit 10    PT Start Time 1520    PT Stop Time 1558    PT Time Calculation (min) 38 min    Activity Tolerance Patient tolerated treatment well    Behavior During Therapy [facility] for tasks assessed/performed              Past Medical History:  Diagnosis Date   Anxiety    Depression    Hemorrhoids during pregnancy in first trimester 03/30/2021   Past Surgical History:  Procedure Laterality Date   HEMORRHOID SURGERY     internal hemorrhoids/ had had necrosis at the time/ rectal prolapse with fissure   OVARIAN CYST REMOVAL Right    Patient Active Problem List   Diagnosis Date Noted   Acquired dysplasia of hip 11/27/2022   Dyshidrotic eczema 11/27/2022   Chronic idiopathic urticaria 03/30/2021   Chronic sciatica, left 03/30/2021    PCP:  Mort Sawyers, FNP  REFERRING PROVIDER: Mort Sawyers, FNP  REFERRING DIAG: Acquired dysplasia of hip, unspecified laterality, Chronic bil sciatica with low back pain, Bil hip dysplagia    Rationale for Evaluation and Treatment: Rehabilitation  THERAPY DIAG:  Other low back pain  Neuralgia and neuritis  Pain of both hip joints  ONSET DATE: chronic since 2015-2016, worsening about 2 months prior to PT eval SUBJECTIVE:                                                                                                                                                                                           SUBJECTIVE STATEMENT: Patient states after last PT session she had "terrible aching pain" just medial to right iliac crest and at right lower back and stabbing pain near  the right ASIS. She states she can still feel where it was bruised where she was trying to dig in her hands to relieve the pain. She states she is sitting and leaning on that spot she is in pain. If she puts pressure on her low back in a sitting or leaning position she has worse pain. She states she has had various sciatic pain shooting intermittently. She states her right hip feels locked up. Does notice when she flexes her neck she feels it deeply in  the low back.    PAIN: Are you having pain? Yes NPRS: Current: 2/10 at low back.   FUNCTIONAL LIMITATIONS: difficulty with general mobility, doing her usual activities without thinking about it or making modifications, prolonged positions, prolonged sitting, sleeping, exercise, too much activity, practicing art.  LEISURE: 9 cats and a husband, does art stuff and trying to get that off the ground, art includes soldering  PRECAUTIONS: None   WEIGHT BEARING RESTRICTIONS: No  FALLS:  Has patient fallen in last 6 months? Yes. Number of falls cannot remember and not worried about falling, but she is clumsy with poor special awareness.  OCCUPATION: home maker, artist  PLOF: Independent  PATIENT GOALS: "try and get some pain relief and perhaps something to go forward with to continue to alleviate pain so I can get back to normal life"  NEXT MD VISIT: no appointment scheduled   OBJECTIVE  Vitals:   12/16/22 1528  BP: 109/70  Pulse: 99  SpO2: 100%  Automatic cuff     PERIPHERAL JOINT MOTION (in degrees) ACTIVE RANGE OF MOTION (PROM) *Indicates pain 12/16/22 Date Date  Joint/Motion R/L R/L R/L  Hip     External rotation 40/42 / /  Internal rotation  44/38 / /  Comments:   MUSCLE PERFORMANCE (MMT):  Hip  Flexion: R = 4/5, L = 4+/5. Abduction: R = 5/5, L = 4+/5. Knee Ext: R = 5/5, L = 5/5. Flex: R = 5/5, L = 5/5. Ankle (seated position) Eversion: R = 5/5, L = 5/5. Great toe extension: R = 4/5, L = 4/5.  SPECIAL TESTS: SIJ  SPECIAL TESTS SIJ distraction: R = positive, L = negative. SIJ compression: R = positive, L = negative. Thigh thrust: R = positive, L = negative. Sacral thrust: R = positive, L = negative. Gaenslen's: R = negative, L = negative (left side off the table caused a lot of lateral/anterior hip pain on the left).    TODAY'S TREATMENT:   Therapeutic exercise: to centralize symptoms and improve ROM, strength, muscular endurance, and activity tolerance required for successful completion of functional activities.  - vitals screen (see above).  - continued exam (see above) - hooklying bent knee fall out with ADIM, 1x10 each side AROM, 1x10 each side with GTB looped around knees, 1x10 each side with BlackTB looped around knees.  - bridge with hip abduction against BlackTB looped around knees, 1x10 with 5 second hold.  - Education on HEP including handout   Pt required multimodal cuing for proper technique and to facilitate improved neuromuscular control, strength, range of motion, and functional ability resulting in improved performance and form.  PATIENT EDUCATION:  Education details: Education on diagnosis, prognosis, POC, anatomy and physiology of current condition.  Person educated: Patient Education method: Explanation Education comprehension: verbalized understanding and needs further education  HOME EXERCISE PROGRAM: Access Code: [email or web address] URL: [email or web address] Date: 12/16/2022 Prepared by: Norton Blizzard  Exercises - Hooklying Single Leg Bent Knee Fallouts with Resistance  - 1 x daily - 3 sets - 10 reps - Bridge with Hip Abduction and Resistance - Ground Touches  - 1 x daily - 2 sets - 10 reps - 5 seconds hold  ASSESSMENT:  CLINICAL IMPRESSION: Patient arrives reporting pain exacerbation after initial evaluation that caused sleep loss for at least 2 nights. Today's session included further exam, that confirmed likely right sided SIJ pain with all SIJ stress tests  positive except Gaenslen's test. Patient continues to present with need for strengthening and and  stabilization and this was initiated with exercises that were performed within tolerated ROM. Patient demonstrated good motor control for low level ADIM. Plan to advance exercises gradually to help prevent pain exacerbation. Patient would benefit from continued management of limiting condition by skilled physical therapist to address remaining impairments and functional limitations to work towards stated goals and return to PLOF or maximal functional independence.   From initial PT eval on 12/10/2022:  Patient is a 42 y.o. female referred to outpatient physical therapy with a medical diagnosis of acquired dysplasia of hip, unspecified laterality, chronic bil sciatica with low back pain, bil hip dysplagia who presents with signs and symptoms consistent with chronic low back pain with bilateral sciatica and hip pain/instability in the setting of bilateral hip dysplasia and likely global hypermobility and decreased kinesthesia. Patient would benefit form further assessment of SIJ and hips to further elucidate impairments contributing to her functional limitations and participation restrictions. Upon today's exam, patient demonstrates of bilateral hip instability, positive bilateral SLR test for neurotension, and painful CPA with referral to buttocks region in the low back and sacrum. Patient presents with significant pain, ROM, joint hypermobility, neurodynamic, motor control, muscle tension, muscle performance (strength/power/endurance), knowledge, allodynia, hyperesthesia, and activity tolerance impairments that are limiting ability to complete general mobility, doing her usual activities without thinking about it or making modifications, prolonged positions, prolonged sitting, sleeping, exercise, too much activity, practicing art without difficulty. Patient will benefit from skilled physical therapy intervention to  address current body structure impairments and activity limitations to improve function and work towards goals set in current POC in order to return to prior level of function or maximal functional improvement.    OBJECTIVE IMPAIRMENTS: decreased activity tolerance, decreased coordination, decreased endurance, decreased knowledge of condition, decreased strength, impaired perceived functional ability, increased muscle spasms, improper body mechanics, pain, and joint hypermobility, increased flexibility .   ACTIVITY LIMITATIONS: carrying, lifting, bending, sitting, standing, squatting, sleeping, hygiene/grooming, locomotion level, and caring for others  PARTICIPATION LIMITATIONS: cleaning, laundry, interpersonal relationship, community activity, occupation, and   difficulty with general mobility, doing her usual activities without thinking about it or making modifications, prolonged positions, prolonged sitting, sleeping, exercise, too much activity, practicing art  PERSONAL FACTORS: Age, Fitness, Past/current experiences, Profession, Time since onset of injury/illness/exacerbation, and 3+ comorbidities:   Chronic idiopathic urticaria; Chronic sciatica, left; Acquired dysplasia of hip; and Dyshidrotic eczema on their problem list.  has a past medical history of Anxiety, Depression, and Hemorrhoids during pregnancy in first trimester (03/30/2021).  has a past surgical history that includes Ovarian cyst removal (Right) and Hemorrhoid surgery. Former smoker, chronic neck pain with right radicular symptoms are also affecting patient's functional outcome.   REHAB POTENTIAL: Fair due to chronicity, complexity,  and nature of condition  CLINICAL DECISION MAKING: Evolving/moderate complexity  EVALUATION COMPLEXITY: Moderate    GOALS: Goals reviewed with patient? No  SHORT TERM GOALS: Target date: 12/24/2022  Patient will be independent with initial home exercise program for self-management of  symptoms. Baseline: Initial HEP to be provided at visit 2 as appropriate (12/10/22); initial HEP provided at visit #2 (12/16/2022);  Goal status: In-progress   LONG TERM GOALS: Target date: 03/04/2023  Patient will be independent with a long-term home exercise program for self-management of symptoms.  Baseline: Initial HEP to be provided at visit 2 as appropriate (12/10/22); initial HEP provided at visit #2 (12/16/2022);  Goal status: In progress  2.  Patient will demonstrate improved FOTO to equal or greater than 71  by visit #10 to demonstrate improvement in overall condition and self-reported functional ability.  Baseline: 61 (12/10/22); Goal status: In-progress  3.  Patient will report pain equal or less than 3/10 with functional activities to improve her ability to sleep and complete usual mobility such as stepping down from a curb with less difficulty. Baseline: up to 8.5/10 (12/10/22); Goal status: In-progress  4.  Patient will demonstrate improvement in Patient Specific Functional Scale (PSFS) of equal or greater than 3 points to reflect clinically significant improvement in patient's most valued functional activities. Baseline: to be tested visit 2 as appropriate (12/10/22); Goal status: In-progress  PLAN:  PT FREQUENCY: 1-2x/week  PT DURATION: 12 weeks  PLANNED INTERVENTIONS: 97164- PT Re-evaluation, 97110-Therapeutic exercises, 97530- Therapeutic activity, [id number]- Neuromuscular re-education, 97535- Self Care, 62130- Manual therapy, [id number]- Aquatic Therapy, 97014- Electrical stimulation (unattended), Patient/Family education, Dry Needling, Joint mobilization, Spinal mobilization, Cryotherapy, and Moist heat.  PLAN FOR NEXT SESSION: further examine impairments as appropriate, update HEP as appropriate, progressive LE/core/functional stabilization/strengthening/motor control program as tolerated, manual therapy / dry needling as needed, education.    Cira Rue, PT,  DPT 12/17/2022, 8:46 AM  [facility] [address] [place], [place] [address] P: [phone] I F: [phone]

## 2022-12-17 ENCOUNTER — Encounter: Payer: Self-pay | Admitting: Physical Therapy

## 2022-12-20 DIAGNOSIS — Z419 Encounter for procedure for purposes other than remedying health state, unspecified: Secondary | ICD-10-CM | POA: Diagnosis not present

## 2022-12-25 IMAGING — US US PELVIS COMPLETE WITH TRANSVAGINAL
1 series · 13 of 25 positions shown · non-contrast
Comparison: 04/05/2021
COMPARISON: 04/05/2021

Addendum:
CLINICAL DATA: Abdominal pain after spontaneous abortion

EXAM:
TRANSABDOMINAL AND TRANSVAGINAL ULTRASOUND OF PELVIS
DOPPLER ULTRASOUND OF OVARIES
TECHNIQUE: Both transabdominal and transvaginal ultrasound examinations of the
pelvis were performed. Transabdominal technique was performed for
global imaging of the pelvis including uterus, ovaries, adnexal
regions, and pelvic cul-de-sac.
It was necessary to proceed with endovaginal exam following the
transabdominal exam to visualize the endometrium and ovaries. Color
and duplex Doppler ultrasound was utilized to evaluate blood flow to
the ovaries.

[Series 1: us pelvic complete with transvaginal · 13 of 90 slices shown]
[im 1/90]
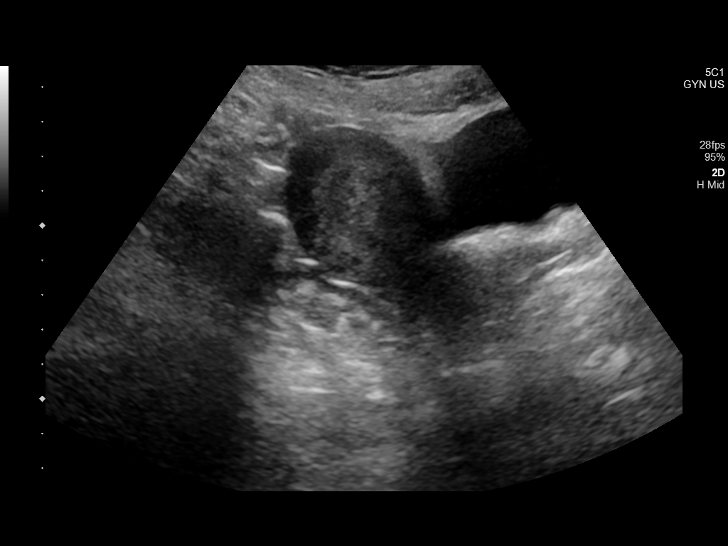
[im 8/90]
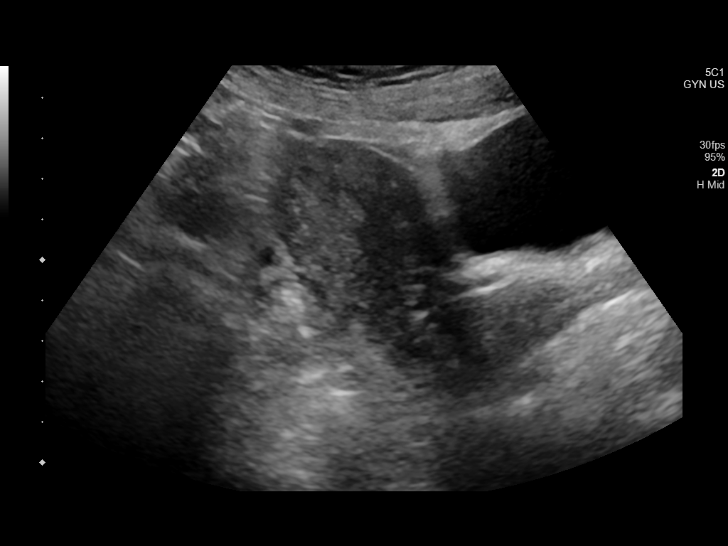
[im 15/90]
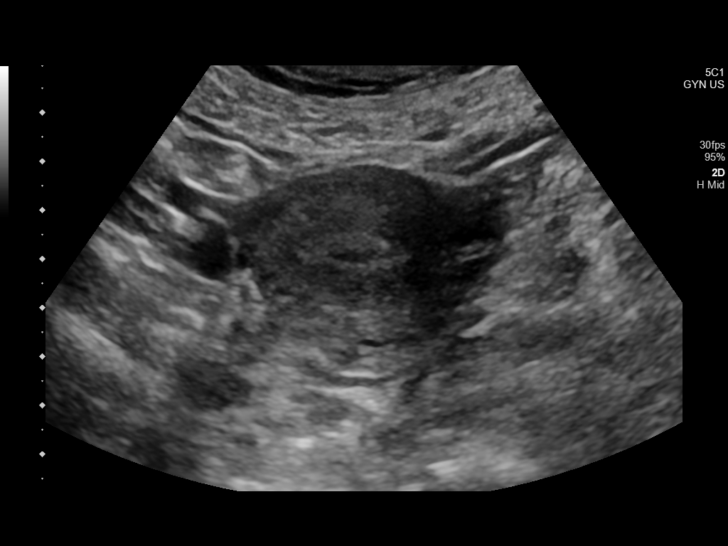
[im 23/90]
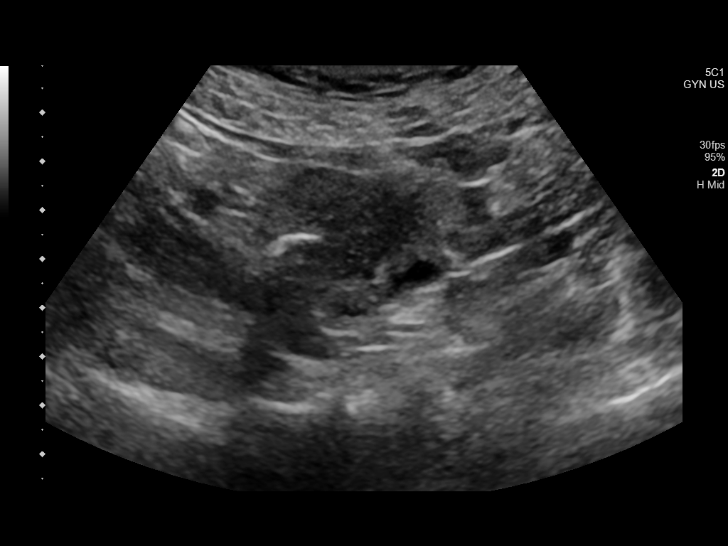
[im 30/90]
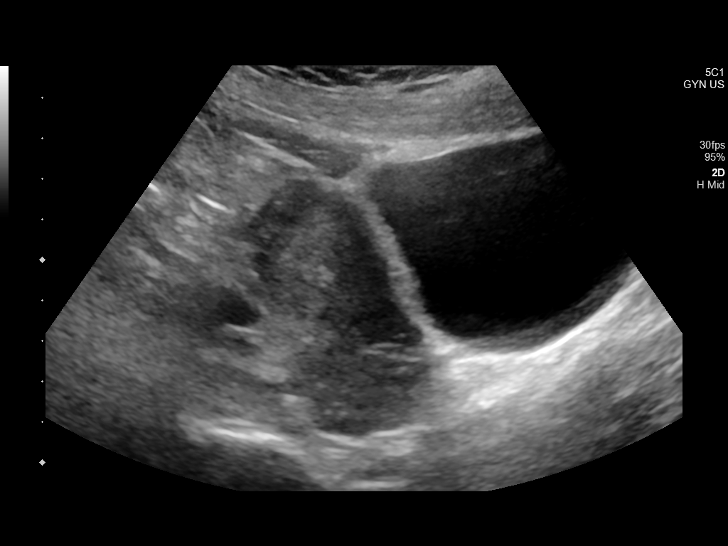
[im 38/90]
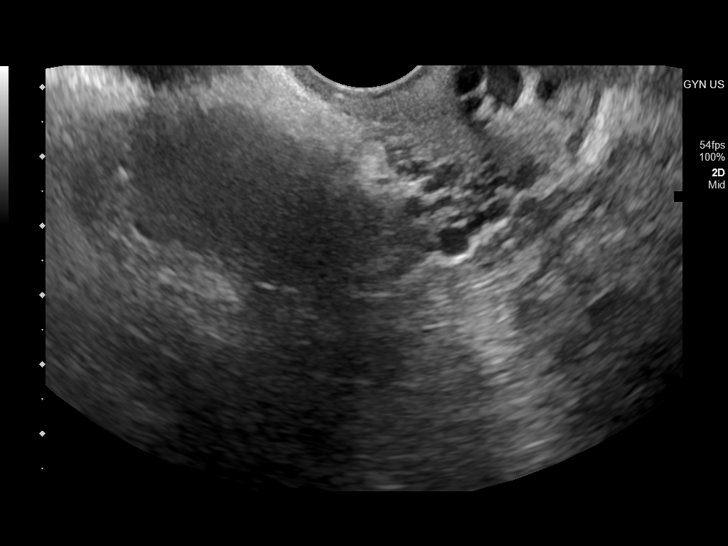
[im 45/90]
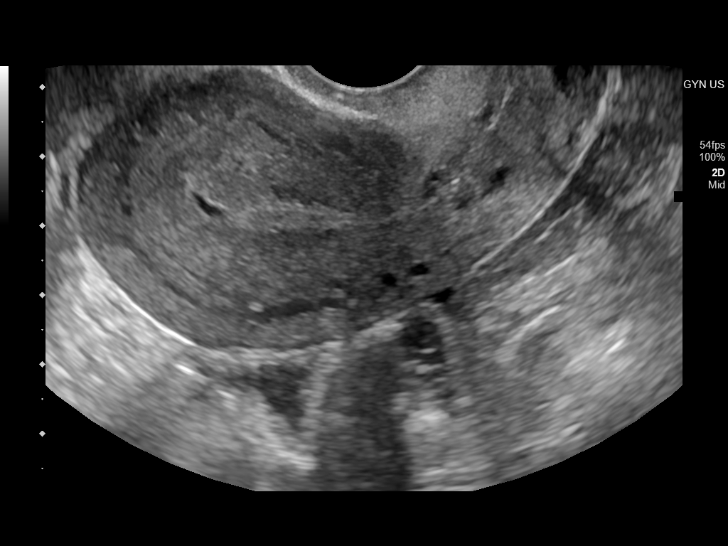
[im 52/90]
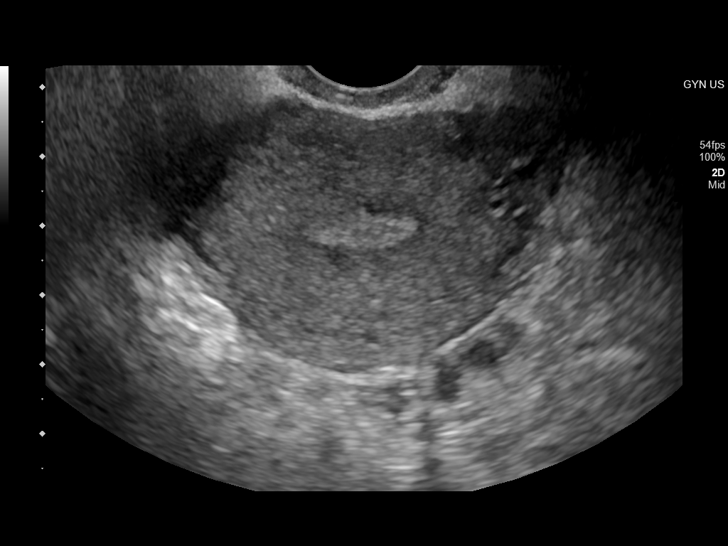
[im 60/90]
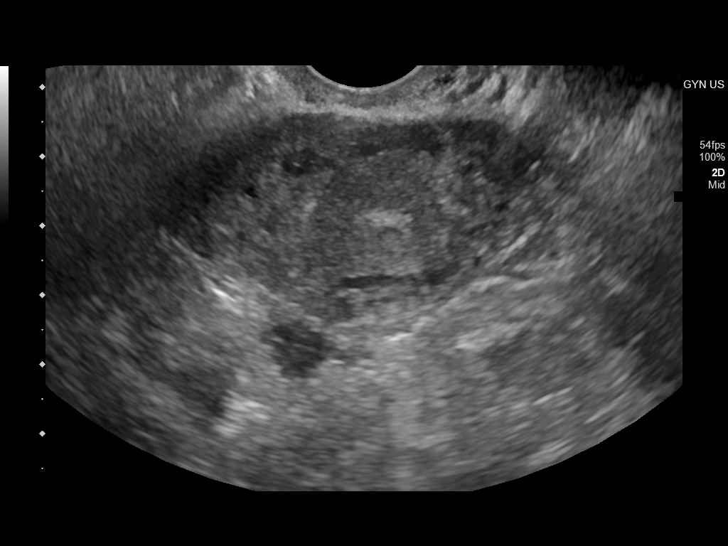
[im 67/90]
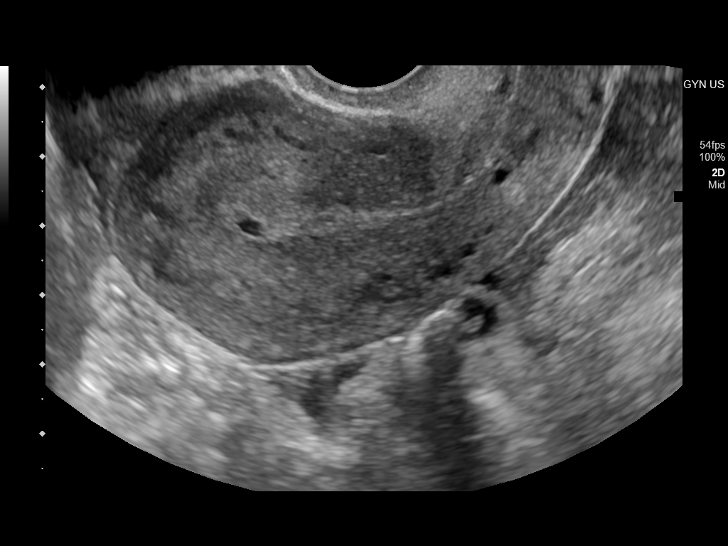
[im 75/90]
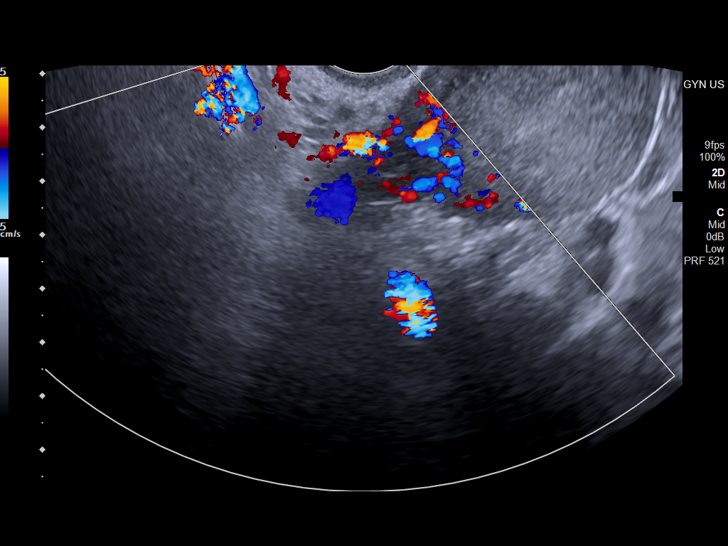
[im 82/90]
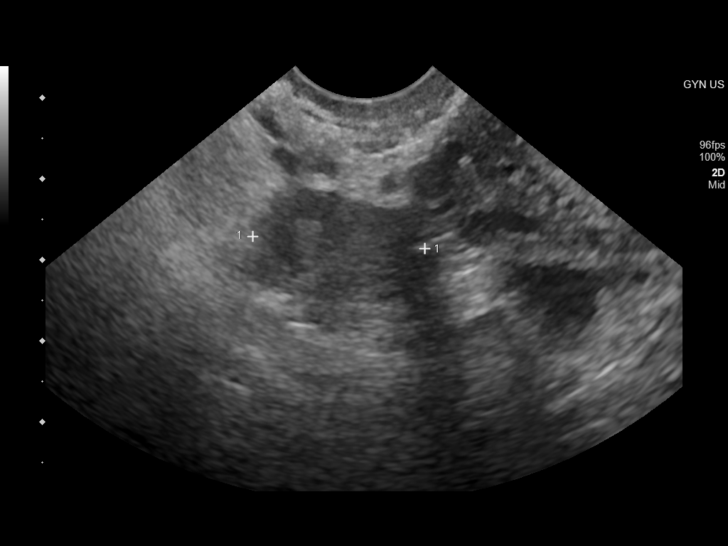
[im 90/90]
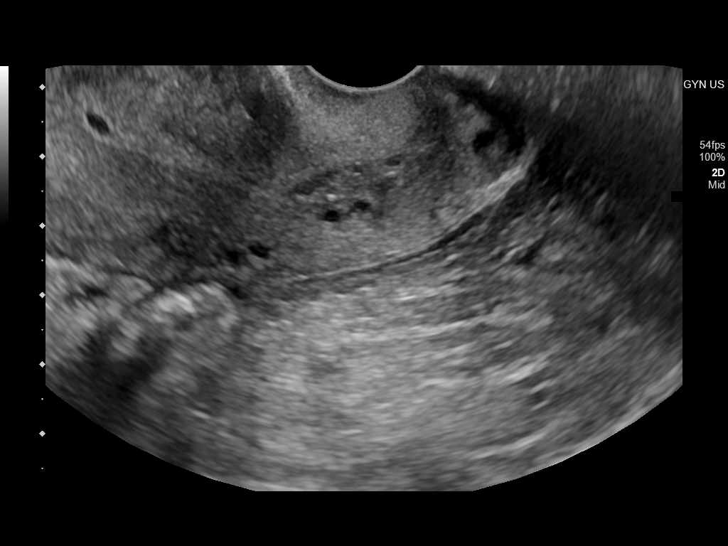

[13 of 25 positions shown; findings below may reference images not displayed]

FINDINGS: Uterus

Measurements: 7.8 x 4.1 x 5 cm = volume: 83.3 mL. No fibroids or
other mass visualized.

Endometrium

Thickness: 5 mm. There is trace amount of fluid in the endometrial
cavity within the fundus. There is no abnormal increased vascularity
in the endometrium.

Right ovary

Measurements: 3 x 1.4 x 2.1 cm = volume: 4.5 mL. Normal
appearance/no adnexal mass.

Left ovary

Measurements: 2.7 x 1.7 x 1.5 cm = volume: 3.4 mL. Normal
appearance/no adnexal mass.

Pulsed Doppler evaluation of both ovaries demonstrates normal
low-resistance arterial and venous waveforms.

Other findings

Trace amount of free fluid is seen in the pelvis.
IMPRESSION: Small amount of fluid is seen in the endometrial cavity within the
fundus, possibly blood. There is no abnormal thickening of
endometrial stripe. There is no abnormal increased vascularity in
the endometrium.

Trace amount of free fluid in the pelvis may be physiological. There
are no dominant adnexal masses.

ADDENDUM:
This addendum is made to clarify Doppler technique used for the
study. Color flow Doppler examination was done. Pulsed Doppler
examination was not performed.

*** End of Addendum ***
FINDINGS: Uterus

Measurements: 7.8 x 4.1 x 5 cm = volume: 83.3 mL. No fibroids or
other mass visualized.

Endometrium

Thickness: 5 mm. There is trace amount of fluid in the endometrial
cavity within the fundus. There is no abnormal increased vascularity
in the endometrium.

Right ovary

Measurements: 3 x 1.4 x 2.1 cm = volume: 4.5 mL. Normal
appearance/no adnexal mass.

Left ovary

Measurements: 2.7 x 1.7 x 1.5 cm = volume: 3.4 mL. Normal
appearance/no adnexal mass.

Pulsed Doppler evaluation of both ovaries demonstrates normal
low-resistance arterial and venous waveforms.

Other findings

Trace amount of free fluid is seen in the pelvis.
IMPRESSION: Small amount of fluid is seen in the endometrial cavity within the
fundus, possibly blood. There is no abnormal thickening of
endometrial stripe. There is no abnormal increased vascularity in
the endometrium.

Trace amount of free fluid in the pelvis may be physiological. There
are no dominant adnexal masses.

## 2022-12-30 ENCOUNTER — Ambulatory Visit: Admitting: Physical Therapy

## 2023-01-01 ENCOUNTER — Encounter: Payer: Self-pay | Admitting: Physical Therapy

## 2023-01-01 ENCOUNTER — Ambulatory Visit: Attending: Family | Admitting: Physical Therapy

## 2023-01-01 ENCOUNTER — Encounter: Payer: Self-pay | Admitting: Family

## 2023-01-01 ENCOUNTER — Ambulatory Visit: Admitting: Family

## 2023-01-01 VITALS — BP 106/78 | HR 71 | Temp 97.8°F | Wt 130.0 lb

## 2023-01-01 DIAGNOSIS — L301 Dyshidrosis [pompholyx]: Secondary | ICD-10-CM

## 2023-01-01 DIAGNOSIS — M792 Neuralgia and neuritis, unspecified: Secondary | ICD-10-CM | POA: Diagnosis not present

## 2023-01-01 DIAGNOSIS — M25511 Pain in right shoulder: Secondary | ICD-10-CM | POA: Insufficient documentation

## 2023-01-01 DIAGNOSIS — R21 Rash and other nonspecific skin eruption: Secondary | ICD-10-CM | POA: Diagnosis not present

## 2023-01-01 DIAGNOSIS — M25552 Pain in left hip: Secondary | ICD-10-CM | POA: Insufficient documentation

## 2023-01-01 DIAGNOSIS — G8929 Other chronic pain: Secondary | ICD-10-CM | POA: Diagnosis not present

## 2023-01-01 DIAGNOSIS — L2989 Other pruritus: Secondary | ICD-10-CM

## 2023-01-01 DIAGNOSIS — M25551 Pain in right hip: Secondary | ICD-10-CM | POA: Diagnosis not present

## 2023-01-01 DIAGNOSIS — M5459 Other low back pain: Secondary | ICD-10-CM | POA: Diagnosis not present

## 2023-01-01 DIAGNOSIS — T781XXA Other adverse food reactions, not elsewhere classified, initial encounter: Secondary | ICD-10-CM

## 2023-01-01 DIAGNOSIS — T7819XA Other adverse food reactions, not elsewhere classified, initial encounter: Secondary | ICD-10-CM

## 2023-01-01 DIAGNOSIS — M25512 Pain in left shoulder: Secondary | ICD-10-CM | POA: Insufficient documentation

## 2023-01-01 DIAGNOSIS — M542 Cervicalgia: Secondary | ICD-10-CM | POA: Diagnosis not present

## 2023-01-01 NOTE — Therapy (Signed)
OUTPATIENT PHYSICAL THERAPY TREATMENT   Patient Name: Marissa Munoz MRN: [id number] DOB:[birth year], 42 y.o., female Today's Date: 01/01/2023  END OF SESSION:  PT End of Session - 01/01/23 1931     Visit Number 3    Number of Visits 17    Date for PT Re-Evaluation 03/04/23    Authorization Type [employer]  reporting period from 12/10/2022    Progress Note Due on Visit 10    PT Start Time 1650    PT Stop Time 1730    PT Time Calculation (min) 40 min    Activity Tolerance Patient limited by pain    Behavior During Therapy [facility] for tasks assessed/performed               Past Medical History:  Diagnosis Date   Anxiety    Depression    Hemorrhoids during pregnancy in first trimester 03/30/2021   Past Surgical History:  Procedure Laterality Date   HEMORRHOID SURGERY     internal hemorrhoids/ had had necrosis at the time/ rectal prolapse with fissure   OVARIAN CYST REMOVAL Right    Patient Active Problem List   Diagnosis Date Noted   Acquired dysplasia of hip 11/27/2022   Dyshidrotic eczema 11/27/2022   Chronic idiopathic urticaria 03/30/2021   Chronic sciatica, left 03/30/2021    PCP:  Mort Sawyers, FNP  REFERRING PROVIDER: Mort Sawyers, FNP  REFERRING DIAG: Acquired dysplasia of hip, unspecified laterality, Chronic bil sciatica with low back pain, Bil hip dysplagia    Rationale for Evaluation and Treatment: Rehabilitation  THERAPY DIAG:  Other low back pain  Neuralgia and neuritis  Pain of both hip joints  ONSET DATE: chronic since 2015-2016, worsening about 2 months prior to PT eval SUBJECTIVE:                                                                                                                                                                                           SUBJECTIVE STATEMENT: Patient states she is doing okay. She states she did the exercises but inconsistently, partially due to increased pain. She had pain inside her hip joints  that she thought might have come from doing the exercises consistently for a couple of days or from sleeping on a hotel bed (that seemed comfortable). She states sciatic pain has been bad, L > R.  Bridge with abduction caused pain at both SIJ. She currently has pain at B SIJ and right hip feels like it is out of the place.  She wonders if her doctor sent the PT order for her neck and shoulder because her left shoulder has been feeling like it  is popping out of place frequently.    PAIN: Are you having pain? Yes NPRS: Current: 3/10 left shoulder, low back 3/10 changes with movement.   FUNCTIONAL LIMITATIONS: difficulty with general mobility, doing her usual activities without thinking about it or making modifications, prolonged positions, prolonged sitting, sleeping, exercise, too much activity, practicing art.  LEISURE: 9 cats and a husband, does art stuff and trying to get that off the ground, art includes soldering  PRECAUTIONS: None   WEIGHT BEARING RESTRICTIONS: No  FALLS:  Has patient fallen in last 6 months? Yes. Number of falls cannot remember and not worried about falling, but she is clumsy with poor special awareness.  OCCUPATION: home maker, artist  PLOF: Independent  PATIENT GOALS: "try and get some pain relief and perhaps something to go forward with to continue to alleviate pain so I can get back to normal life"  NEXT MD VISIT: no appointment scheduled   OBJECTIVE   TODAY'S TREATMENT:   Therapeutic exercise: to centralize symptoms and improve ROM, strength, muscular endurance, and activity tolerance required for successful completion of functional activities.  - wall sit with theraball behind back, 1x45-55 seconds near 90 degrees flexion - wall sit with theraball behind back with one heel lifted, near 90 degrees flexion, 2x40-55 seconds with each heel raised, ~ 30 seconds or more rest between sets. Two with B isometric shoulder ER with light glute band around hands and  one with shoulders flexed with isometric horizontal abduction against GTB when flexed to 90 degrees.  - stride standing hip hinge with left thigh hinge hold isometric adduction into foam roll pressed into wall (longitudinally), discontinued due to discomfort at hip with hinge.  - isometric split squat with left thigh isometric adduction into foam roll at knee pressed into wall, 1x30 seconds each side.  - hooklying bent knee fall out with ADIM, 1x5 on R side with glute band, focus on decreased ROM for comfort and last rep was held for 15 seconds.  - hooklying dead bug variations with ADIM: arm movement only with legs on red theraball ~5 each sdie, attempted holding legs at 90/90 position but too uncomfortable on the low back, hooklying marches with arms moving from full flexion to 90 degrees flexion in coordination with LE, 1x4 each side ( - Education on HEP including handout   Pt required multimodal cuing for proper technique and to facilitate improved neuromuscular control, strength, range of motion, and functional ability resulting in improved performance and form.  PATIENT EDUCATION:  Education details: Education on diagnosis, prognosis, POC, anatomy and physiology of current condition.  Person educated: Patient Education method: Explanation Education comprehension: verbalized understanding and needs further education  HOME EXERCISE PROGRAM: Access Code: [email or web address] URL: [email or web address] Date: 01/01/2023 Prepared by: Norton Blizzard  Exercises - Wall Squat  - 3-5 x weekly - 2-3 sets - 40 seconds hold - Split Squats Upright Trunk (Quad Bias)  - 3-5 x weekly - 2 reps - 30 seconcds hold - Hooklying Single Leg Bent Knee Fallouts with Resistance  - 3-5 x weekly - 2-3 sets - 10 reps - Dead Bug  - 3-5 x weekly - 1-3 sets - 10 reps  ASSESSMENT:  CLINICAL IMPRESSION: Patient arrives continuing to report symptoms of intermittent pain and instable feeling at multiple joints  consistent with joint instability and hypermobility. Today's session focused on progressing/updating exercises for appropriately challenging strengthening, especially with well tolerated mid range isometrics. Patient was limited in hip hinge exercise due to feeling of discomfort/instability  in left hip, dead bug was limited by pain in thoracic spine while pressing low back into mat to activate core, and patient had pinching in anterior left hip with bent knee fall out past a certain range. These exercises were modified to improve comfort and success, and HEP was modified to better match her tolerance during today's session. She did note that by the end of session she had a 3/10 sharp pain down the left leg to the calf. Patient continues to present with likely hypermobility and poor joint stability which requires strengthening. Plan to consider assessing upper quarter pain next session per patient preference since new PT order is received.  Patient would benefit from continued management of limiting condition by skilled physical therapist to address remaining impairments and functional limitations to work towards stated goals and return to PLOF or maximal functional independence.   From initial PT eval on 12/10/2022:  Patient is a 42 y.o. female referred to outpatient physical therapy with a medical diagnosis of acquired dysplasia of hip, unspecified laterality, chronic bil sciatica with low back pain, bil hip dysplagia who presents with signs and symptoms consistent with chronic low back pain with bilateral sciatica and hip pain/instability in the setting of bilateral hip dysplasia and likely global hypermobility and decreased kinesthesia. Patient would benefit form further assessment of SIJ and hips to further elucidate impairments contributing to her functional limitations and participation restrictions. Upon today's exam, patient demonstrates of bilateral hip instability, positive bilateral SLR test for  neurotension, and painful CPA with referral to buttocks region in the low back and sacrum. Patient presents with significant pain, ROM, joint hypermobility, neurodynamic, motor control, muscle tension, muscle performance (strength/power/endurance), knowledge, allodynia, hyperesthesia, and activity tolerance impairments that are limiting ability to complete general mobility, doing her usual activities without thinking about it or making modifications, prolonged positions, prolonged sitting, sleeping, exercise, too much activity, practicing art without difficulty. Patient will benefit from skilled physical therapy intervention to address current body structure impairments and activity limitations to improve function and work towards goals set in current POC in order to return to prior level of function or maximal functional improvement.    OBJECTIVE IMPAIRMENTS: decreased activity tolerance, decreased coordination, decreased endurance, decreased knowledge of condition, decreased strength, impaired perceived functional ability, increased muscle spasms, improper body mechanics, pain, and joint hypermobility, increased flexibility .   ACTIVITY LIMITATIONS: carrying, lifting, bending, sitting, standing, squatting, sleeping, hygiene/grooming, locomotion level, and caring for others  PARTICIPATION LIMITATIONS: cleaning, laundry, interpersonal relationship, community activity, occupation, and   difficulty with general mobility, doing her usual activities without thinking about it or making modifications, prolonged positions, prolonged sitting, sleeping, exercise, too much activity, practicing art  PERSONAL FACTORS: Age, Fitness, Past/current experiences, Profession, Time since onset of injury/illness/exacerbation, and 3+ comorbidities:   Chronic idiopathic urticaria; Chronic sciatica, left; Acquired dysplasia of hip; and Dyshidrotic eczema on their problem list.  has a past medical history of Anxiety, Depression,  and Hemorrhoids during pregnancy in first trimester (03/30/2021).  has a past surgical history that includes Ovarian cyst removal (Right) and Hemorrhoid surgery. Former smoker, chronic neck pain with right radicular symptoms are also affecting patient's functional outcome.   REHAB POTENTIAL: Fair due to chronicity, complexity,  and nature of condition  CLINICAL DECISION MAKING: Evolving/moderate complexity  EVALUATION COMPLEXITY: Moderate    GOALS: Goals reviewed with patient? No  SHORT TERM GOALS: Target date: 12/24/2022  Patient will be independent with initial home exercise program for self-management of symptoms. Baseline: Initial HEP  to be provided at visit 2 as appropriate (12/10/22); initial HEP provided at visit #2 (12/16/2022);  Goal status: In-progress   LONG TERM GOALS: Target date: 03/04/2023  Patient will be independent with a long-term home exercise program for self-management of symptoms.  Baseline: Initial HEP to be provided at visit 2 as appropriate (12/10/22); initial HEP provided at visit #2 (12/16/2022);  Goal status: In progress  2.  Patient will demonstrate improved FOTO to equal or greater than 71 by visit #10 to demonstrate improvement in overall condition and self-reported functional ability.  Baseline: 61 (12/10/22); Goal status: In-progress  3.  Patient will report pain equal or less than 3/10 with functional activities to improve her ability to sleep and complete usual mobility such as stepping down from a curb with less difficulty. Baseline: up to 8.5/10 (12/10/22); Goal status: In-progress  4.  Patient will demonstrate improvement in Patient Specific Functional Scale (PSFS) of equal or greater than 3 points to reflect clinically significant improvement in patient's most valued functional activities. Baseline: to be tested visit 2 as appropriate (12/10/22); Goal status: In-progress  PLAN:  PT FREQUENCY: 1-2x/week  PT DURATION: 12 weeks  PLANNED  INTERVENTIONS: 97164- PT Re-evaluation, 97110-Therapeutic exercises, 97530- Therapeutic activity, [id number]- Neuromuscular re-education, 97535- Self Care, 60454- Manual therapy, [id number]- Aquatic Therapy, 97014- Electrical stimulation (unattended), Patient/Family education, Dry Needling, Joint mobilization, Spinal mobilization, Cryotherapy, and Moist heat.  PLAN FOR NEXT SESSION: further examine impairments as appropriate, update HEP as appropriate, progressive LE/core/functional stabilization/strengthening/motor control program as tolerated, manual therapy / dry needling as needed, education.    Cira Rue, PT, DPT 01/01/2023, 7:46 PM  [facility] [address] [place], [place] [address] P: [phone] I F: [phone]

## 2023-01-01 NOTE — Progress Notes (Signed)
   Established Patient Office Visit  Subjective:   Patient ID: Marissa Munoz, female    DOB: [birth year]  Age: 42 y.o. MRN: [id number]  CC:  Chief Complaint  Patient presents with   Medical Management of Chronic Issues    HPI: Marissa Munoz is a 42 y.o. female presenting on 01/01/2023 for Medical Management of Chronic Issues  Dyshidrotic eczema: has had some improvement with clobetasol however still with flare ups and can't seem to completely get out of control. Still always itchy despite cream, and daily xyzal however sometimes only takes prn   Does have a dermatology appt in the next two weeks as well.            ROS: Negative unless specifically indicated above in HPI.   Relevant past medical history reviewed and updated as indicated.   Allergies and medications reviewed and updated.   Current Outpatient Medications:    clobetasol cream (TEMOVATE) 0.05 %, Apply to feet twice daily for two weeks then twice weekly maintenance to feet, Disp: 30 g, Rfl: 0   levocetirizine (XYZAL) 5 MG tablet, Take 5 mg by mouth every evening., Disp: , Rfl:    triamcinolone cream (KENALOG) 0.1 %, Apply 1 Application topically 2 (two) times daily., Disp: 30 g, Rfl: 0  Allergies  Allergen Reactions   Codeine Nausea And Vomiting    Objective:   BP 106/78 (BP Location: Left Arm, Patient Position: Sitting, Cuff Size: Normal)   Pulse 71   Temp 97.8 F (36.6 C) (Temporal)   Wt 130 lb (59 kg)   SpO2 97%   BMI 23.03 kg/m    Physical Exam Skin:    Comments: Left lateral palm with small blisters raised with some mild erythema  Right palm with central blistering and mild erythema        Assessment & Plan:  Dyshidrotic eczema Assessment & Plan: Continue clobetasol cream prn  Food and environmental allergens today pending results.  Maintain appt with dermatology as scheduled.   Orders: -     Food Allergy Profile w/ Reflexes -     Allergens (22) Foods -     Resp Allergy  Profile Regn2DC [place] MD [place] -     RPR  Chronic pruritic rash in adult -     Food Allergy Profile w/ Reflexes -     Allergens (22) Foods -     Resp Allergy Profile Regn2DC DE MD [place]  Gastrointestinal food sensitivity -     Food Allergy Profile w/ Reflexes -     Allergens (22) Foods -     Resp Allergy Profile Regn2DC DE MD [place]  Rash of hand -     RPR     Follow up plan: Return if symptoms worsen or fail to improve.  Mort Sawyers, FNP

## 2023-01-01 NOTE — Assessment & Plan Note (Signed)
Continue clobetasol cream prn  Food and environmental allergens today pending results.  Maintain appt with dermatology as scheduled.

## 2023-01-03 ENCOUNTER — Other Ambulatory Visit: Payer: Self-pay | Admitting: Family

## 2023-01-03 DIAGNOSIS — T781XXA Other adverse food reactions, not elsewhere classified, initial encounter: Secondary | ICD-10-CM

## 2023-01-03 DIAGNOSIS — Z888 Allergy status to other drugs, medicaments and biological substances status: Secondary | ICD-10-CM

## 2023-01-03 LAB — ALLERGENS (22) FOODS IGG
Casein IgG: 2.4 ug/mL — ABNORMAL HIGH (ref 0.0–1.9)
Cheese, Mold Type IgG: 10.3 ug/mL — ABNORMAL HIGH (ref 0.0–1.9)
Chicken IgG: <2 ug/mL (ref 0.0–1.9)
Chocolate/Cacao IgG: <2 ug/mL (ref 0.0–1.9)
Coffee IgG: <2 ug/mL (ref 0.0–1.9)
Corn IgG: <2 ug/mL (ref 0.0–1.9)
Egg, Whole IgG: 2 ug/mL — ABNORMAL HIGH (ref 0.0–1.9)
Green Bean IgG: <2 ug/mL (ref 0.0–1.9)
Haddock IgG: <2 ug/mL (ref 0.0–1.9)
Lamb IgG: 5.1 ug/mL — ABNORMAL HIGH (ref 0.0–1.9)
Oat IgG: 2.8 ug/mL — ABNORMAL HIGH (ref 0.0–1.9)
Onion IgG: <2 ug/mL (ref 0.0–1.9)
Peanut IgG: <2 ug/mL (ref 0.0–1.9)
Pork IgG: 7.2 ug/mL — ABNORMAL HIGH (ref 0.0–1.9)
Potato, White, IgG: <2 ug/mL (ref 0.0–1.9)
Rye IgG: 3.1 ug/mL — ABNORMAL HIGH (ref 0.0–1.9)
Shrimp IgG: <2 ug/mL (ref 0.0–1.9)
Soybean IgG: <2 ug/mL (ref 0.0–1.9)
Tomato IgG: <2 ug/mL (ref 0.0–1.9)
Wheat IgG: 3.2 ug/mL — ABNORMAL HIGH (ref 0.0–1.9)
Yeast IgG: <2 ug/mL (ref 0.0–1.9)

## 2023-01-06 LAB — RESPIRATORY ALLERGY PROFILE REGION II ~~LOC~~
Allergen, A. alternata, m6: <0.1 kU/L
Allergen, Cedar tree, t12: 0.11 kU/L — ABNORMAL HIGH
Allergen, Comm Silver Birch, t9: <0.1 kU/L
Allergen, Cottonwood, t14: 0.23 kU/L — ABNORMAL HIGH
Allergen, D pternoyssinus,d7: 0.21 kU/L — ABNORMAL HIGH
Allergen, Mouse Urine Protein, e78: <0.1 kU/L
Allergen, Mulberry, t76: 0.23 kU/L — ABNORMAL HIGH
Allergen, Oak,t7: <0.1 kU/L
Allergen, P. notatum, m1: <0.1 kU/L
Aspergillus fumigatus, m3: <0.1 kU/L
Bermuda Grass: <0.1 kU/L
Box Elder IgE: 0.1 kU/L — ABNORMAL HIGH
CLADOSPORIUM HERBARUM (M2) IGE: <0.1 kU/L
COMMON RAGWEED (SHORT) (W1) IGE: 1.64 kU/L — ABNORMAL HIGH
Cat Dander: 0.13 kU/L — ABNORMAL HIGH
Class: 0
Class: 0
Class: 0
Class: 0
Class: 0
Class: 0
Class: 0
Class: 0
Class: 0
Class: 0
Class: 2
Cockroach: 0.26 kU/L — ABNORMAL HIGH
D. farinae: 0.17 kU/L — ABNORMAL HIGH
Dog Dander: 0.1 kU/L — ABNORMAL HIGH
Elm IgE: 0.19 kU/L — ABNORMAL HIGH
IgE (Immunoglobulin E), Serum: 1651 kU/L — ABNORMAL HIGH (ref <=114)
Johnson Grass: 0.12 kU/L — ABNORMAL HIGH
Pecan/Hickory Tree IgE: 0.12 kU/L — ABNORMAL HIGH
Rough Pigweed  IgE: <0.1 kU/L
Sheep Sorrel IgE: <0.1 kU/L
Timothy Grass: 0.12 kU/L — ABNORMAL HIGH

## 2023-01-06 LAB — FOOD ALLERGY PROFILE W/ REFLEXES
Allergen, Salmon, f41: <0.1 kU/L
Almonds: <0.1 kU/L
CLASS: 0
CLASS: 0
CLASS: 0
CLASS: 0
CLASS: 0
CLASS: 0
CLASS: 0
CLASS: 0
CLASS: 0
CLASS: 0
Cashew IgE: <0.1 kU/L
Class: 0
Class: 1
Egg White IgE: <0.1 kU/L
Fish Cod: <0.1 kU/L
Hazelnut: <0.1 kU/L
Milk IgE: 0.28 kU/L — ABNORMAL HIGH
Peanut IgE: 0.36 kU/L — ABNORMAL HIGH
Scallop IgE: <0.1 kU/L
Sesame Seed f10: <0.1 kU/L
Shrimp IgE: 0.23 kU/L — ABNORMAL HIGH
Soybean IgE: <0.1 kU/L
Tuna IgE: <0.1 kU/L
Walnut: <0.1 kU/L
Wheat IgE: 0.12 kU/L — ABNORMAL HIGH

## 2023-01-06 LAB — INTERPRETATION:

## 2023-01-06 LAB — MILK COMPONENT PANEL RFLX
Allergen, Alpha-lactalb,f76: <0.1 kU/L
Allergen, Beta-lactoglob,f77: 0.23 kU/L — ABNORMAL HIGH
Allergen, Casein, f78: <0.1 kU/L
CLASS: 0
CLASS: 0

## 2023-01-06 LAB — RPR: RPR Ser Ql: NONREACTIVE

## 2023-01-06 LAB — DOG DANDER COMPONENT
Can f 4(e229) IgE: <0.1 kU/L (ref <0.10)
Can f 6(e230) IgE: <0.1 kU/L (ref <0.10)
E101-IgE Can f 1: <0.1 kU/L (ref <0.10)
E102-IgE Can f 2: <0.1 kU/L (ref <0.10)
E221-IgE Can f 3: <0.1 kU/L (ref <0.10)
E226-IgE Can f 5: <0.1 kU/L (ref <0.10)

## 2023-01-06 LAB — PEANUT COMPONENT PANEL REFLEX
Ara h 1 (f422): <0.1 kU/L (ref <0.10)
Ara h 2 (f423): <0.1 kU/L (ref <0.10)
Ara h 3 (f424): 0.1 kU/L — ABNORMAL HIGH (ref <0.10)
Ara h 8 (f352): <0.1 kU/L (ref <0.10)
Ara h 9 (f427: <0.1 kU/L (ref <0.10)
F447-IgE Ara h 6: <0.1 kU/L (ref <0.10)

## 2023-01-06 LAB — CAT DANDER COMPONENT
E220-IgE Fel d 2: <0.1 kU/L (ref <0.10)
E228-IgE Fel d 4: 0.13 kU/L — ABNORMAL HIGH (ref <0.10)
Fel d 1 (e94) IgE: <0.1 kU/L (ref <0.10)
Fel d 7 (e231) IgE: <0.1 kU/L (ref <0.10)

## 2023-01-07 ENCOUNTER — Ambulatory Visit: Admitting: Physical Therapy

## 2023-01-07 DIAGNOSIS — M25552 Pain in left hip: Secondary | ICD-10-CM | POA: Diagnosis not present

## 2023-01-07 DIAGNOSIS — M25511 Pain in right shoulder: Secondary | ICD-10-CM | POA: Diagnosis not present

## 2023-01-07 DIAGNOSIS — M542 Cervicalgia: Secondary | ICD-10-CM

## 2023-01-07 DIAGNOSIS — M5459 Other low back pain: Secondary | ICD-10-CM | POA: Diagnosis not present

## 2023-01-07 DIAGNOSIS — M792 Neuralgia and neuritis, unspecified: Secondary | ICD-10-CM | POA: Diagnosis not present

## 2023-01-07 DIAGNOSIS — G8929 Other chronic pain: Secondary | ICD-10-CM | POA: Diagnosis not present

## 2023-01-07 DIAGNOSIS — M25551 Pain in right hip: Secondary | ICD-10-CM | POA: Diagnosis not present

## 2023-01-07 DIAGNOSIS — M25512 Pain in left shoulder: Secondary | ICD-10-CM | POA: Diagnosis not present
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
## 2023-01-07 NOTE — Progress Notes (Signed)
noted 

## 2023-01-08 ENCOUNTER — Encounter: Payer: Self-pay | Admitting: Physical Therapy

## 2023-01-09 ENCOUNTER — Ambulatory Visit: Admitting: Physical Therapy

## 2023-01-14 ENCOUNTER — Ambulatory Visit: Admitting: Physical Therapy

## 2023-01-14 ENCOUNTER — Encounter: Payer: Self-pay | Admitting: Dermatology

## 2023-01-14 ENCOUNTER — Ambulatory Visit: Admitting: Dermatology

## 2023-01-14 DIAGNOSIS — Z7189 Other specified counseling: Secondary | ICD-10-CM | POA: Diagnosis not present

## 2023-01-14 DIAGNOSIS — L301 Dyshidrosis [pompholyx]: Secondary | ICD-10-CM

## 2023-01-14 MED ORDER — CLOBETASOL PROPIONATE 0.05 % EX CREA: TOPICAL_CREAM | CUTANEOUS | 2 refills | Status: DC

## 2023-01-14 NOTE — Patient Instructions (Addendum)
Continue clobetasol cr twice daily to AA's at hands and feet until smooth. Avoid applying to face, groin, and axilla. Use as directed. Long-term use can cause thinning of the skin.  Topical steroids (such as triamcinolone, fluocinolone, fluocinonide, mometasone, clobetasol, halobetasol, betamethasone, hydrocortisone) can cause thinning and lightening of the skin if they are used for too long in the same area. Your physician has selected the right strength medicine for your problem and area affected on the body. Please use your medication only as directed by your physician to prevent side effects.   Dupilumab (Dupixent) is a treatment given by injection for adults and children with moderate-to-severe atopic dermatitis. Goal is control of skin condition, not cure. It is given as 2 injections at the first dose followed by 1 injection ever 2 weeks thereafter.  Young children are dosed monthly.  Potential side effects include allergic reaction, herpes infections, injection site reactions and conjunctivitis (inflammation of the eyes).  The use of Dupixent requires long term medication management, including periodic office visits.  Due to recent changes in healthcare laws, you may see results of your pathology and/or laboratory studies on MyChart before the doctors have had a chance to review them. We understand that in some cases there may be results that are confusing or concerning to you. Please understand that not all results are received at the same time and often the doctors may need to interpret multiple results in order to provide you with the best plan of care or course of treatment. Therefore, we ask that you please give [place] 2 business days to thoroughly review all your results before contacting the office for clarification. Should we see a critical lab result, you will be contacted sooner.   If You Need Anything After Your Visit  If you have any questions or concerns for your doctor, please call our  main line at [phone] and press option 4 to reach your doctor's medical assistant. If no one answers, please leave a voicemail as directed and we will return your call as soon as possible. Messages left after 4 pm will be answered the following business day.   You may also send [place] a message via MyChart. We typically respond to MyChart messages within 1-2 business days.  For prescription refills, please ask your pharmacy to contact our office. Our fax number is [phone].  If you have an urgent issue when the clinic is closed that cannot wait until the next business day, you can page your doctor at the number below.    Please note that while we do our best to be available for urgent issues outside of office hours, we are not available 24/7.   If you have an urgent issue and are unable to reach [place], you may choose to seek medical care at your doctor's office, retail clinic, urgent care center, or emergency room.  If you have a medical emergency, please immediately call 911 or go to the emergency department.  Pager Numbers  - Dr. Gwen Pounds: [phone]  - Dr. Roseanne Reno: [phone]  - Dr. Katrinka Blazing: [phone]   In the event of inclement weather, please call our main line at [phone] for an update on the status of any delays or closures.  Dermatology Medication Tips: Please keep the boxes that topical medications come in in order to help keep track of the instructions about where and how to use these. Pharmacies typically print the medication instructions only on the boxes and not directly on the medication tubes.  If your medication is too expensive, please contact our office at [phone] option 4 or send [place] a message through MyChart.   We are unable to tell what your co-pay for medications will be in advance as this is different depending on your insurance coverage. However, we may be able to find a substitute medication at lower cost or fill out paperwork to get insurance to  cover a needed medication.   If a prior authorization is required to get your medication covered by your insurance company, please allow [place] 1-2 business days to complete this process.  Drug prices often vary depending on where the prescription is filled and some pharmacies may offer cheaper prices.  The website www.goodrx.com contains coupons for medications through different pharmacies. The prices here do not account for what the cost may be with help from insurance (it may be cheaper with your insurance), but the website can give you the price if you did not use any insurance.  - You can print the associated coupon and take it with your prescription to the pharmacy.  - You may also stop by our office during regular business hours and pick up a GoodRx coupon card.  - If you need your prescription sent electronically to a different pharmacy, notify our office through [facility] or by phone at [phone] option 4.     Si Usted Necesita Algo Despus de Su Visita  Tambin puede enviarnos un mensaje a travs de Clinical cytogeneticist. Por lo general respondemos a los mensajes de MyChart en el transcurso de 1 a 2 das hbiles.  Para renovar recetas, por favor pida a su farmacia que se ponga en contacto con nuestra oficina. Annie Sable de fax es [place] [phone].  Si tiene un asunto urgente cuando la clnica est cerrada y que no puede esperar hasta el siguiente da hbil, puede llamar/localizar a su doctor(a) al nmero que aparece a continuacin.   Por favor, tenga en cuenta que aunque hacemos todo lo posible para estar disponibles para asuntos urgentes fuera del horario de [place], no estamos disponibles las 24 horas del da, los 7 [address]  [address] de la [place].   Si tiene un problema urgente y no puede comunicarse con nosotros, puede optar por buscar atencin mdica  en el consultorio de su doctor(a), en una clnica privada, en un centro de atencin urgente o en una sala de emergencias.  Si tiene Wellsite geologist, por favor llame inmediatamente al 911 o vaya a la sala de emergencias.  Nmeros de bper  - Dr. Gwen Pounds: [phone]  - Dra. Roseanne Reno: [phone]  - Dr. Katrinka Blazing: [phone]   En caso de inclemencias del tiempo, por favor llame a Lacy Duverney principal al [phone] para una actualizacin sobre el [place] de cualquier retraso o cierre.  Consejos para la medicacin en dermatologa: Por favor, guarde las cajas en las que vienen los medicamentos de uso tpico para ayudarle a seguir las instrucciones sobre dnde y cmo usarlos. Las farmacias generalmente imprimen las instrucciones del medicamento slo en las cajas y no directamente en los tubos del [place].   Si su medicamento es muy caro, por favor, pngase en contacto con Rolm Gala llamando al [phone] y presione la opcin 4 o envenos un mensaje a travs de Clinical cytogeneticist.   No podemos decirle cul ser su copago por los medicamentos por adelantado ya que esto es diferente dependiendo de la cobertura de su seguro. Sin embargo, es posible que podamos encontrar un medicamento sustituto a Audiological scientist  un formulario para que el seguro cubra el medicamento que se considera necesario.   Si se requiere una autorizacin previa para que su compaa de seguros [place] su medicamento, por favor permtanos de 1 a 2 das hbiles para completar [address]  Los precios de los medicamentos varan con frecuencia dependiendo del Environmental consultant de dnde se surte la receta y alguna farmacias pueden ofrecer precios ms baratos.  El sitio web www.goodrx.com tiene cupones para medicamentos de Health and safety inspector. Los precios aqu no tienen en cuenta lo que podra costar con la ayuda del seguro (puede ser ms barato con su seguro), pero el sitio web puede darle el precio si no utiliz Tourist information centre manager.  - Puede imprimir el cupn correspondiente y llevarlo con su receta a la farmacia.  - Tambin puede pasar por nuestra oficina durante el  horario de atencin regular y Education officer, museum una tarjeta de cupones de GoodRx.  - Si necesita que su receta se enve electrnicamente a una farmacia diferente, informe a nuestra oficina a travs de MyChart de [place] o por telfono llamando al [phone] y presione la opcin 4.

## 2023-01-14 NOTE — Progress Notes (Signed)
   New Patient Visit   Subjective  Marissa Munoz is a 42 y.o. 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 Patient used clobetasol cr at feet and TMC cr at hands prescribed by PCP. Topicals creams helped at feet and hands to clear dryness but not underlying rash. Patient also taking Xyzal for hives. Hx of hives for years Patient has seen allergist and has had allergy testing done.    The patient has spots, moles and lesions to be evaluated, some may be new or changing and the patient may have concern these could be cancer.   The following portions of the chart were reviewed this encounter and updated as appropriate: medications, allergies, medical history  Review of Systems:  No other skin or systemic complaints except as noted in HPI or Assessment and Plan.  Objective  Well appearing patient in no apparent distress; mood and affect are within normal limits.   A focused examination was performed of the following areas: Hands, feet, arms, legs  Relevant exam findings are noted in the Assessment and Plan.  hands, feet Hyperkeratotic lichenified scaly plaques of bilateral palms. L > R medial foot deep vesicles and erythematous scaly macules    Assessment & Plan     Dyshidrotic eczema hands, feet  Chronic flaring not at patient goal. Partially responsive to topical steroids failed clobetasol triamcinolone  Discussed Dupixent but patient prefers to use topicals. She is planning to start IVF next month and would like to make sure it's ok to use while trying to get pregnant.  Continue clobetasol cr twice daily to AA's until smooth. Avoid applying to face, groin, and axilla. Use as directed. Long-term use can cause thinning of the skin.  Topical steroids (such as triamcinolone, fluocinolone, fluocinonide, mometasone, clobetasol, halobetasol, betamethasone, hydrocortisone) can cause thinning and lightening of the skin if they are used for too  long in the same area. Your physician has selected the right strength medicine for your problem and area affected on the body. Please use your medication only as directed by your physician to prevent side effects.    Related Medications triamcinolone cream (KENALOG) 0.1 % Apply 1 Application topically 2 (two) times daily.  clobetasol cream (TEMOVATE) 0.05 % Apply to feet and hands twice daily until smooth. Avoid applying to face, groin, and axilla. Use as directed. Long-term use can cause thinning of the skin.    Return if symptoms worsen or fail to improve.  Anise Salvo, RMA, am acting as scribe for Elie Goody, MD .   Documentation: I have reviewed the above documentation for accuracy and completeness, and I agree with the above.  Elie Goody, MD

## 2023-01-19 DIAGNOSIS — Z419 Encounter for procedure for purposes other than remedying health state, unspecified: Secondary | ICD-10-CM | POA: Diagnosis not present

## 2023-01-20 ENCOUNTER — Ambulatory Visit: Admitting: Physical Therapy

## 2023-01-22 ENCOUNTER — Ambulatory Visit: Admitting: Physical Therapy

## 2023-01-28 ENCOUNTER — Encounter: Admitting: Physical Therapy

## 2023-01-30 ENCOUNTER — Encounter: Admitting: Physical Therapy

## 2023-02-19 DIAGNOSIS — Z419 Encounter for procedure for purposes other than remedying health state, unspecified: Secondary | ICD-10-CM | POA: Diagnosis not present

## 2023-03-03 ENCOUNTER — Telehealth: Payer: Self-pay

## 2023-03-03 NOTE — Telephone Encounter (Signed)
 Patient called and stated that she verified insurance coverage for Dupixent, and confirmed with fertility specialist that medication would not interfere with IVF. May I send in prescription to: Med[facility] by mail [facility] fax [phone]

## 2023-03-09 ENCOUNTER — Encounter: Payer: Self-pay | Admitting: Dermatology

## 2023-03-09 ENCOUNTER — Telehealth: Payer: Self-pay | Admitting: Dermatology

## 2023-03-09 NOTE — Telephone Encounter (Signed)
-----   Message ----- From: Orland Mustard ([facility]) Sent: 03/05/2023   9:28 AM EST To: Elie Goody, MD [facility]) Subject: RE: Dupixent use during IVF and pregnancy  I appreciate your note. This is what I sent to her in my message: "Dupilumab  is a biologic medication (not a steroid) used to treat several inflammatory conditions by inhibiting the interleukin-4 (IL-4) receptor alpha subunit, which plays a key role in the inflammatory response.  It has not been studied during pregnancy, but there are no proven harmful effects." Probably TMI. To clarify, she may use Dupixent while trying to conceive, but should stop as soon as she is pregnant.  Many thanks for your attention to detail!      ----- Message ----- From: Elie Goody [facility]) Sent: 03/04/2023   8:26 PM EST To: Orland Mustard, MD ([facility]) Subject: Dupixent use during IVF and pregnancy  Dear Dr Claybon Jabs,  I hope you've been well. I'm a dermatologist who has seen Ms Mathur for eczema. I received a message from her stating that you approved the use of Dupixent for eczema during IVF and pregnancy? I didn't see this in your note. Uptodate says Dupixent in pregnancy is not well-studied. It's present in breast milk and crosses the placenta. Did you say it was ok for Ms Pearman to take Dupixent? I'm not challenging your expertise; I just want to confirm.  Thank you, Herbert Deaner, [facility]

## 2023-03-22 DIAGNOSIS — Z419 Encounter for procedure for purposes other than remedying health state, unspecified: Secondary | ICD-10-CM | POA: Diagnosis not present

## 2023-04-19 DIAGNOSIS — Z419 Encounter for procedure for purposes other than remedying health state, unspecified: Secondary | ICD-10-CM | POA: Diagnosis not present

## 2023-05-31 DIAGNOSIS — Z419 Encounter for procedure for purposes other than remedying health state, unspecified: Secondary | ICD-10-CM | POA: Diagnosis not present

## 2023-06-24 ENCOUNTER — Ambulatory Visit (INDEPENDENT_AMBULATORY_CARE_PROVIDER_SITE_OTHER): Admitting: Dermatology

## 2023-06-24 ENCOUNTER — Encounter: Payer: Self-pay | Admitting: Dermatology

## 2023-06-24 DIAGNOSIS — L732 Hidradenitis suppurativa: Secondary | ICD-10-CM | POA: Diagnosis not present

## 2023-06-24 DIAGNOSIS — L723 Sebaceous cyst: Secondary | ICD-10-CM

## 2023-06-24 NOTE — Progress Notes (Signed)
 Follow-Up Visit   Subjective  Marissa Munoz is a 43 y.o. female who presents for the following: Inflamed cyst of the back and groin area large and very tender to the touch, no hx of rupture and drainage. Pt would like treated today. She has a history of cysts in the past and has been prescribed Doxycycline. Patient may be moving to [place] soon.    The following portions of the chart were reviewed this encounter and updated as appropriate: medications, allergies, medical history  Review of Systems:  No other skin or systemic complaints except as noted in HPI or Assessment and Plan.  Objective  Well appearing patient in no apparent distress; mood and affect are within normal limits.   A focused examination was performed of the following areas:   Relevant exam findings are noted in the Assessment and Plan.  Mid Back 5 cm subQ indurated fluctuant nodule with overlying erythema   Assessment & Plan   INFLAMED SEBACEOUS CYST Mid Back Pt currently trying to conceive, will not prescribe Doxycycline at this time.  Incision and Drainage - Mid Back Location: mid back   Informed Consent: Discussed risks (permanent scarring, light or dark discoloration, infection, pain, bleeding, bruising, redness, damage to adjacent structures, and recurrence of the lesion) and benefits of the procedure, as well as the alternatives.  Incision will heal with scar that will disrupt tattoo. Informed consent was obtained.  Preparation: The area was prepped with alcohol.  Anesthesia: Lidocaine 1% with epinephrine 16 cc  Procedure Details: An incision with 11 blade was made overlying the lesion. Multiple internal incisions made to clear loculations and produce more drainage with pressure. Corroborates patient report of an inflammatory flare prior to today's episode. The lesion drained pus, white, chalky cyst material, and blood.   A large amount of fluid was drained.     Antibiotic ointment and a sterile  pressure dressing were applied. The patient tolerated procedure well.  Total number of lesions drained: 1  Plan: The patient was instructed on post-op care. Recommend OTC analgesia as needed for pain. When not inflamed, consider excision. Will leave scar that will disrupt tattoo  Complex I&D   Anaerobic and Aerobic Culture - Mid Back HIDRADENITIS SUPPURATIVA    HIDRADENITIS SUPPURATIVA, hurley II Exam: Inflamed subQ sinus tract with open comedones at R inguinal crease  Chronic and persistent condition with duration or expected duration over one year. Condition is bothersome/symptomatic for patient. Currently flared.  Hidradenitis Suppurativa is a chronic; persistent; non-curable, but treatable condition due to abnormal inflamed sweat glands in the body folds (axilla, inframammary, groin, medial thighs), causing recurrent painful draining cysts and scarring. It can be associated with severe scarring acne and cysts; also abscesses and scarring of scalp. The goal is control and prevention of flares, as it is not curable. Scars are permanent and can be thickened. Treatment may include daily use of topical medication and oral antibiotics.  Oral isotretinoin may also be helpful.  For some cases, Humira or Cosentyx (biologic injections) may be prescribed to decrease the inflammatory process and prevent flares.  When indicated, inflamed cysts may also be treated surgically.  Treatment Plan: Discussed that systemic treatments are not proven safe in pregnancy (doxy, biologic injections, etc), pt currently TTC, defer at this time. Discussed surgery to remove tract after inflammation is controlled with medication. Recommend Hibiclens wash daily in the shower and warm compresses when flares occur.  Return in about 3 weeks (around 07/15/2023) for inflamed cyst recheck.  Arlinda Lais, CMA, am acting as scribe for Harris Liming, MD .   Documentation: I have reviewed the above documentation for  accuracy and completeness, and I agree with the above.  Harris Liming, MD

## 2023-06-24 NOTE — Patient Instructions (Addendum)
 Recommend Hibiclens (chlorhexidene) wash daily in the shower for hidradenitis suppurative.   Due to recent changes in healthcare laws, you may see results of your pathology and/or laboratory studies on MyChart before the doctors have had a chance to review them. We understand that in some cases there may be results that are confusing or concerning to you. Please understand that not all results are received at the same time and often the doctors may need to interpret multiple results in order to provide you with the best plan of care or course of treatment. Therefore, we ask that you please give us  2 business days to thoroughly review all your results before contacting the office for clarification. Should we see a critical lab result, you will be contacted sooner.   If You Need Anything After Your Visit  If you have any questions or concerns for your doctor, please call our main line at [phone] and press option 4 to reach your doctor's medical assistant. If no one answers, please leave a voicemail as directed and we will return your call as soon as possible. Messages left after 4 pm will be answered the following business day.   You may also send us  a message via MyChart. We typically respond to MyChart messages within 1-2 business days.  For prescription refills, please ask your pharmacy to contact our office. Our fax number is [phone].  If you have an urgent issue when the clinic is closed that cannot wait until the next business day, you can page your doctor at the number below.    Please note that while we do our best to be available for urgent issues outside of office hours, we are not available 24/7.   If you have an urgent issue and are unable to reach us , you may choose to seek medical care at your doctor's office, retail clinic, urgent care center, or emergency room.  If you have a medical emergency, please immediately call 911 or go to the emergency department.  Pager  Numbers  - Dr. Bary Likes: [phone]  - Dr. Annette Barters: [phone]  - Dr. Felipe Horton: [phone]   In the event of inclement weather, please call our main line at [phone] for an update on the status of any delays or closures.  Dermatology Medication Tips: Please keep the boxes that topical medications come in in order to help keep track of the instructions about where and how to use these. Pharmacies typically print the medication instructions only on the boxes and not directly on the medication tubes.   If your medication is too expensive, please contact our office at [phone] option 4 or send us  a message through MyChart.   We are unable to tell what your co-pay for medications will be in advance as this is different depending on your insurance coverage. However, we may be able to find a substitute medication at lower cost or fill out paperwork to get insurance to cover a needed medication.   If a prior authorization is required to get your medication covered by your insurance company, please allow us  1-2 business days to complete this process.  Drug prices often vary depending on where the prescription is filled and some pharmacies may offer cheaper prices.  The website www.goodrx.com contains coupons for medications through different pharmacies. The prices here do not account for what the cost may be with help from insurance (it may be cheaper with your insurance), but the website can give you the price if you did  not use any insurance.  - You can print the associated coupon and take it with your prescription to the pharmacy.  - You may also stop by our office during regular business hours and pick up a GoodRx coupon card.  - If you need your prescription sent electronically to a different pharmacy, notify our office through [facility] or by phone at [phone] option 4.     Si Usted Necesita Algo Despus de Su Visita  Tambin puede enviarnos un mensaje a travs de  Clinical cytogeneticist. Por lo general respondemos a los mensajes de MyChart en el transcurso de 1 a 2 das hbiles.  Para renovar recetas, por favor pida a su farmacia que se ponga en contacto con nuestra oficina. Franz Jacks de fax es [place] [phone].  Si tiene un asunto urgente cuando la clnica est cerrada y que no puede esperar hasta el siguiente da hbil, puede llamar/localizar a su doctor(a) al nmero que aparece a continuacin.   Por favor, tenga en cuenta que aunque hacemos todo lo posible para estar disponibles para asuntos urgentes fuera del horario de [place], no estamos disponibles las 24 horas del da, los 7 [address]  [address] de la [place].   Si tiene un problema urgente y no puede comunicarse con nosotros, puede optar por buscar atencin mdica  en el consultorio de su doctor(a), en una clnica privada, en un centro de atencin urgente o en una sala de emergencias.  Si tiene Engineer, drilling, por favor llame inmediatamente al 911 o vaya a la sala de emergencias.  Nmeros de bper  - Dr. Bary Likes: [phone]  - Dra. Annette Barters: [phone]  - Dr. Felipe Horton: [phone]   En caso de inclemencias del tiempo, por favor llame a Lajuan Pila principal al [phone] para una actualizacin sobre el [place] de cualquier retraso o cierre.  Consejos para la medicacin en dermatologa: Por favor, guarde las cajas en las que vienen los medicamentos de uso tpico para ayudarle a seguir las instrucciones sobre dnde y cmo usarlos. Las farmacias generalmente imprimen las instrucciones del medicamento slo en las cajas y no directamente en los tubos del [place].   Si su medicamento es muy caro, por favor, pngase en contacto con Bettyjane Brunet llamando al [phone] y presione la opcin 4 o envenos un mensaje a travs de Clinical cytogeneticist.   No podemos decirle cul ser su copago por los medicamentos por adelantado ya que esto es diferente dependiendo de la cobertura de su seguro. Sin embargo, es posible que  podamos encontrar un medicamento sustituto a Audiological scientist un formulario para que el seguro cubra el medicamento que se considera necesario.   Si se requiere una autorizacin previa para que su compaa de seguros [place] su medicamento, por favor permtanos de 1 a 2 das hbiles para completar este proceso.  Los precios de los medicamentos varan con frecuencia dependiendo del Environmental consultant de dnde se surte la receta y alguna farmacias pueden ofrecer precios ms baratos.  El sitio web www.goodrx.com tiene cupones para medicamentos de Health and safety inspector. Los precios aqu no tienen en cuenta lo que podra costar con la ayuda del seguro (puede ser ms barato con su seguro), pero el sitio web puede darle el precio si no utiliz Tourist information centre manager.  - Puede imprimir el cupn correspondiente y llevarlo con su receta a la farmacia.  - Tambin puede pasar por nuestra oficina durante el horario de atencin regular y Education officer, museum una tarjeta de cupones de GoodRx.  - Si necesita que su receta se  enve electrnicamente a una farmacia diferente, informe a nuestra oficina a travs de MyChart de [place] o por telfono llamando al [phone] y presione la opcin 4.

## 2023-06-26 ENCOUNTER — Ambulatory Visit: Admitting: Dermatology

## 2023-06-30 DIAGNOSIS — Z419 Encounter for procedure for purposes other than remedying health state, unspecified: Secondary | ICD-10-CM | POA: Diagnosis not present

## 2023-07-01 ENCOUNTER — Ambulatory Visit: Payer: Self-pay | Admitting: Dermatology

## 2023-07-01 LAB — ANAEROBIC AND AEROBIC CULTURE

## 2023-07-16 ENCOUNTER — Encounter: Payer: Self-pay | Admitting: Dermatology

## 2023-07-16 ENCOUNTER — Ambulatory Visit (INDEPENDENT_AMBULATORY_CARE_PROVIDER_SITE_OTHER): Admitting: Dermatology

## 2023-07-16 DIAGNOSIS — L720 Epidermal cyst: Secondary | ICD-10-CM

## 2023-07-16 MED ORDER — MUPIROCIN 2 % EX OINT: 1.0000 | TOPICAL_OINTMENT | Freq: Every day | CUTANEOUS | 1 refills | Status: AC

## 2023-07-16 NOTE — Progress Notes (Signed)
   Follow-Up Visit   Subjective  Marissa Munoz is a 43 y.o. female who presents for the following: cyst mid back to be excised The patient has spots, moles and lesions to be evaluated, some may be new or changing and the patient may have concern these could be cancer.   The following portions of the chart were reviewed this encounter and updated as appropriate: medications, allergies, medical history  Review of Systems:  No other skin or systemic complaints except as noted in HPI or Assessment and Plan.  Objective  Well appearing patient in no apparent distress; mood and affect are within normal limits.   A focused examination was performed of the following areas: back  Relevant exam findings are noted in the Assessment and Plan.  Mid Back SQ nodule 2.8cm  Assessment & Plan     RUPTURED EPIDERMAL CYST Mid Back Cyst vs other excised today Start Mupirocin oint qd to excision site Skin excision - Mid Back  Excision method:  elliptical Total excision diameter (cm):  2.8 Informed consent: discussed and consent obtained   Timeout: patient name, date of birth, surgical site, and procedure verified   Procedure prep:  Patient was prepped and draped in usual sterile fashion Prep type:  Chlorhexidine Anesthesia: the lesion was anesthetized in a standard fashion   Anesthetic:  1% lidocaine w/ epinephrine 1-100,000 buffered w/ 8.4% NaHCO3 (15cc lido w/ epi) Instrument used: #15 blade   Hemostasis achieved with: suture, pressure and electrodesiccation   Outcome: patient tolerated procedure well with no complications    Skin repair - Mid Back Complexity:  Intermediate Final length (cm):  4.3 Informed consent: discussed and consent obtained   Timeout: patient name, date of birth, surgical site, and procedure verified   Procedure prep:  Patient was prepped and draped in usual sterile fashion Prep type:  Chlorhexidine Anesthesia: the lesion was anesthetized in a standard fashion    Anesthetic:  1% lidocaine w/ epinephrine 1-100,000 buffered w/ 8.4% NaHCO3 Reason for type of repair: reduce tension to allow closure, reduce the risk of dehiscence, infection, and necrosis, reduce subcutaneous dead space and avoid a hematoma, allow closure of the large defect and preserve normal anatomy   Undermining: edges undermined   Subcutaneous layers (deep stitches):  Suture size:  3-0 Suture type: Monocryl (poliglecaprone 25)   Stitches:  Buried vertical mattress Fine/surface layer approximation (top stitches):  Suture size:  4-0 Suture type: Prolene (polypropylene)   Stitches: simple running   Hemostasis achieved with: suture, pressure and electrodesiccation Outcome: patient tolerated procedure well with no complications   Post-procedure details: sterile dressing applied and wound care instructions given   Dressing type: bandage, pressure dressing and bacitracin    mupirocin ointment (BACTROBAN) 2 % - Mid Back Apply 1 Application topically daily. Qd to excision site Specimen 1 - Surgical pathology Differential Diagnosis: Cyst vs other  Check Margins: No SQ nodule 2.8cm  Return in about 1 week (around 07/23/2023) for suture removal.  I, Sonya Hupman, RMA, am acting as scribe for Harris Liming, MD .   Documentation: I have reviewed the above documentation for accuracy and completeness, and I agree with the above.  Harris Liming, MD

## 2023-07-16 NOTE — Patient Instructions (Signed)

## 2023-07-17 ENCOUNTER — Telehealth: Payer: Self-pay

## 2023-07-17 NOTE — Telephone Encounter (Signed)
 Left message on voicemail to return my call if she is having any issues following yesterday's surgery.

## 2023-07-17 NOTE — Telephone Encounter (Signed)
-----   Message from [facility] Sonya H sent at 07/16/2023  4:59 PM EDT ----- Will one of you call my surgery patient tomorrow?  Thank you! Sonya

## 2023-07-22 ENCOUNTER — Ambulatory Visit: Payer: Self-pay | Admitting: Dermatology

## 2023-07-22 LAB — SURGICAL PATHOLOGY

## 2023-07-23 ENCOUNTER — Encounter: Payer: Self-pay | Admitting: Dermatology

## 2023-07-23 ENCOUNTER — Ambulatory Visit: Admitting: Dermatology

## 2023-07-23 DIAGNOSIS — Z4802 Encounter for removal of sutures: Secondary | ICD-10-CM

## 2023-07-23 DIAGNOSIS — L720 Epidermal cyst: Secondary | ICD-10-CM

## 2023-07-23 DIAGNOSIS — Z5189 Encounter for other specified aftercare: Secondary | ICD-10-CM

## 2023-07-23 NOTE — Progress Notes (Signed)
   Follow-Up Visit   Subjective  Marissa Munoz is a 43 y.o. female who presents for the following: Suture removal  Pathology showed CONSISTENT WITH RUPTURED AND INFLAMED CYST    The following portions of the chart were reviewed this encounter and updated as appropriate: medications, allergies, medical history  Review of Systems:  No other skin or systemic complaints except as noted in HPI or Assessment and Plan.  Objective  Well appearing patient in no apparent distress; mood and affect are within normal limits.  Areas Examined: back Relevant physical exam findings are noted in the Assessment and Plan.    Assessment & Plan   VISIT FOR WOUND CHECK   ENCOUNTER FOR REMOVAL OF SUTURES   Encounter for Removal of Sutures - Incision site is clean, dry and intact. - Wound cleansed, sutures removed, wound cleansed and steri strips applied.  - Discussed pathology results showing CONSISTENT WITH RUPTURED AND INFLAMED CYST   - Patient advised to keep steri-strips dry until they fall off. - Scars remodel for a full year. - Once steri-strips fall off, patient can apply over-the-counter silicone scar cream once to twice a day to help with scar remodeling if desired. - Patient advised to call with any concerns or if they notice any new or changing lesions.  No follow-ups on file.  Kerstin Peeling, RMA, am acting as scribe for Harris Liming, MD .   Documentation: I have reviewed the above documentation for accuracy and completeness, and I agree with the above.  Harris Liming, MD

## 2023-07-31 DIAGNOSIS — Z419 Encounter for procedure for purposes other than remedying health state, unspecified: Secondary | ICD-10-CM | POA: Diagnosis not present

## 2023-08-30 DIAGNOSIS — Z419 Encounter for procedure for purposes other than remedying health state, unspecified: Secondary | ICD-10-CM | POA: Diagnosis not present

## 2023-09-30 DIAGNOSIS — Z419 Encounter for procedure for purposes other than remedying health state, unspecified: Secondary | ICD-10-CM | POA: Diagnosis not present

## 2023-10-31 DIAGNOSIS — Z419 Encounter for procedure for purposes other than remedying health state, unspecified: Secondary | ICD-10-CM | POA: Diagnosis not present

## 2023-11-10 ENCOUNTER — Telehealth: Payer: Self-pay

## 2023-11-10 DIAGNOSIS — L301 Dyshidrosis [pompholyx]: Secondary | ICD-10-CM

## 2023-11-10 NOTE — Telephone Encounter (Signed)
 [facility] sent request asking for RFS on Clobetasol  Cream. Last prescribed in November 2024. OK to refill or patient needs appt?

## 2023-11-11 MED ORDER — CLOBETASOL PROPIONATE 0.05 % EX CREA: TOPICAL_CREAM | CUTANEOUS | 0 refills | Status: AC
